# Patient Record
Sex: Female | Born: 1985 | Race: White | Hispanic: No | Marital: Married | Smoking: Never smoker
Health system: Southern US, Community
[De-identification: ages and names within clinical notes are randomized; demographics above are authoritative.]

## PROBLEM LIST (undated history)

## (undated) DIAGNOSIS — N912 Amenorrhea, unspecified: Secondary | ICD-10-CM

## (undated) DIAGNOSIS — T1491XA Suicide attempt, initial encounter: Secondary | ICD-10-CM

## (undated) DIAGNOSIS — F319 Bipolar disorder, unspecified: Secondary | ICD-10-CM

## (undated) HISTORY — DX: Bipolar disorder, unspecified: F31.9

## (undated) HISTORY — DX: Amenorrhea, unspecified: N91.2

## (undated) HISTORY — PX: WISDOM TOOTH EXTRACTION: SHX21

## (undated) HISTORY — DX: Suicide attempt, initial encounter: T14.91XA

---

## 2011-09-18 LAB — HM PAP SMEAR: HM Pap smear: NORMAL

## 2012-05-23 ENCOUNTER — Ambulatory Visit (INDEPENDENT_AMBULATORY_CARE_PROVIDER_SITE_OTHER): Payer: PRIVATE HEALTH INSURANCE | Admitting: Internal Medicine

## 2012-05-23 ENCOUNTER — Encounter: Payer: Self-pay | Admitting: Internal Medicine

## 2012-05-23 VITALS — BP 110/80 | HR 67 | Temp 98.4°F | Resp 16 | Wt 98.0 lb

## 2012-05-23 DIAGNOSIS — Z0000 Encounter for general adult medical examination without abnormal findings: Secondary | ICD-10-CM

## 2012-05-23 DIAGNOSIS — Z111 Encounter for screening for respiratory tuberculosis: Secondary | ICD-10-CM | POA: Insufficient documentation

## 2012-05-23 NOTE — Patient Instructions (Signed)
Preventive Care for Adults, Female A healthy lifestyle and preventive care can promote health and wellness. Preventive health guidelines for women include the following key practices.  A routine yearly physical is a good way to check with your caregiver about your health and preventive screening. It is a chance to share any concerns and updates on your health, and to receive a thorough exam.   Visit your dentist for a routine exam and preventive care every 6 months. Brush your teeth twice a day and floss once a day. Good oral hygiene prevents tooth decay and gum disease.   The frequency of eye exams is based on your age, health, family medical history, use of contact lenses, and other factors. Follow your caregiver's recommendations for frequency of eye exams.   Eat a healthy diet. Foods like vegetables, fruits, whole grains, low-fat dairy products, and lean protein foods contain the nutrients you need without too many calories. Decrease your intake of foods high in solid fats, added sugars, and salt. Eat the right amount of calories for you.Get information about a proper diet from your caregiver, if necessary.   Regular physical exercise is one of the most important things you can do for your health. Most adults should get at least 150 minutes of moderate-intensity exercise (any activity that increases your heart rate and causes you to sweat) each week. In addition, most adults need muscle-strengthening exercises on 2 or more days a week.   Maintain a healthy weight. The body mass index (BMI) is a screening tool to identify possible weight problems. It provides an estimate of body fat based on height and weight. Your caregiver can help determine your BMI, and can help you achieve or maintain a healthy weight.For adults 20 years and older:   A BMI below 18.5 is considered underweight.   A BMI of 18.5 to 24.9 is normal.   A BMI of 25 to 29.9 is considered overweight.   A BMI of 30 and above is  considered obese.   Maintain normal blood lipids and cholesterol levels by exercising and minimizing your intake of saturated fat. Eat a balanced diet with plenty of fruit and vegetables. Blood tests for lipids and cholesterol should begin at age 20 and be repeated every 5 years. If your lipid or cholesterol levels are high, you are over 50, or you are at high risk for heart disease, you may need your cholesterol levels checked more frequently.Ongoing high lipid and cholesterol levels should be treated with medicines if diet and exercise are not effective.   If you smoke, find out from your caregiver how to quit. If you do not use tobacco, do not start.   If you are pregnant, do not drink alcohol. If you are breastfeeding, be very cautious about drinking alcohol. If you are not pregnant and choose to drink alcohol, do not exceed 1 drink per day. One drink is considered to be 12 ounces (355 mL) of beer, 5 ounces (148 mL) of wine, or 1.5 ounces (44 mL) of liquor.   Avoid use of street drugs. Do not share needles with anyone. Ask for help if you need support or instructions about stopping the use of drugs.   High blood pressure causes heart disease and increases the risk of stroke. Your blood pressure should be checked at least every 1 to 2 years. Ongoing high blood pressure should be treated with medicines if weight loss and exercise are not effective.   If you are 55 to 27   years old, ask your caregiver if you should take aspirin to prevent strokes.   Diabetes screening involves taking a blood sample to check your fasting blood sugar level. This should be done once every 3 years, after age 45, if you are within normal weight and without risk factors for diabetes. Testing should be considered at a younger age or be carried out more frequently if you are overweight and have at least 1 risk factor for diabetes.   Breast cancer screening is essential preventive care for women. You should practice "breast  self-awareness." This means understanding the normal appearance and feel of your breasts and may include breast self-examination. Any changes detected, no matter how small, should be reported to a caregiver. Women in their 20s and 30s should have a clinical breast exam (CBE) by a caregiver as part of a regular health exam every 1 to 3 years. After age 40, women should have a CBE every year. Starting at age 40, women should consider having a mammography (breast X-ray test) every year. Women who have a family history of breast cancer should talk to their caregiver about genetic screening. Women at a high risk of breast cancer should talk to their caregivers about having magnetic resonance imaging (MRI) and a mammography every year.   The Pap test is a screening test for cervical cancer. A Pap test can show cell changes on the cervix that might become cervical cancer if left untreated. A Pap test is a procedure in which cells are obtained and examined from the lower end of the uterus (cervix).   Women should have a Pap test starting at age 21.   Between ages 21 and 29, Pap tests should be repeated every 2 years.   Beginning at age 30, you should have a Pap test every 3 years as long as the past 3 Pap tests have been normal.   Some women have medical problems that increase the chance of getting cervical cancer. Talk to your caregiver about these problems. It is especially important to talk to your caregiver if a new problem develops soon after your last Pap test. In these cases, your caregiver may recommend more frequent screening and Pap tests.   The above recommendations are the same for women who have or have not gotten the vaccine for human papillomavirus (HPV).   If you had a hysterectomy for a problem that was not cancer or a condition that could lead to cancer, then you no longer need Pap tests. Even if you no longer need a Pap test, a regular exam is a good idea to make sure no other problems are  starting.   If you are between ages 65 and 70, and you have had normal Pap tests going back 10 years, you no longer need Pap tests. Even if you no longer need a Pap test, a regular exam is a good idea to make sure no other problems are starting.   If you have had past treatment for cervical cancer or a condition that could lead to cancer, you need Pap tests and screening for cancer for at least 20 years after your treatment.   If Pap tests have been discontinued, risk factors (such as a new sexual partner) need to be reassessed to determine if screening should be resumed.   The HPV test is an additional test that may be used for cervical cancer screening. The HPV test looks for the virus that can cause the cell changes on the cervix.   The cells collected during the Pap test can be tested for HPV. The HPV test could be used to screen women aged 30 years and older, and should be used in women of any age who have unclear Pap test results. After the age of 30, women should have HPV testing at the same frequency as a Pap test.   Colorectal cancer can be detected and often prevented. Most routine colorectal cancer screening begins at the age of 50 and continues through age 75. However, your caregiver may recommend screening at an earlier age if you have risk factors for colon cancer. On a yearly basis, your caregiver may provide home test kits to check for hidden blood in the stool. Use of a small camera at the end of a tube, to directly examine the colon (sigmoidoscopy or colonoscopy), can detect the earliest forms of colorectal cancer. Talk to your caregiver about this at age 50, when routine screening begins. Direct examination of the colon should be repeated every 5 to 10 years through age 75, unless early forms of pre-cancerous polyps or small growths are found.   Hepatitis C blood testing is recommended for all people born from 1945 through 1965 and any individual with known risks for hepatitis C.    Practice safe sex. Use condoms and avoid high-risk sexual practices to reduce the spread of sexually transmitted infections (STIs). STIs include gonorrhea, chlamydia, syphilis, trichomonas, herpes, HPV, and human immunodeficiency virus (HIV). Herpes, HIV, and HPV are viral illnesses that have no cure. They can result in disability, cancer, and death. Sexually active women aged 25 and younger should be checked for chlamydia. Older women with new or multiple partners should also be tested for chlamydia. Testing for other STIs is recommended if you are sexually active and at increased risk.   Osteoporosis is a disease in which the bones lose minerals and strength with aging. This can result in serious bone fractures. The risk of osteoporosis can be identified using a bone density scan. Women ages 65 and over and women at risk for fractures or osteoporosis should discuss screening with their caregivers. Ask your caregiver whether you should take a calcium supplement or vitamin D to reduce the rate of osteoporosis.   Menopause can be associated with physical symptoms and risks. Hormone replacement therapy is available to decrease symptoms and risks. You should talk to your caregiver about whether hormone replacement therapy is right for you.   Use sunscreen with sun protection factor (SPF) of 30 or more. Apply sunscreen liberally and repeatedly throughout the day. You should seek shade when your shadow is shorter than you. Protect yourself by wearing long sleeves, pants, a wide-brimmed hat, and sunglasses year round, whenever you are outdoors.   Once a month, do a whole body skin exam, using a mirror to look at the skin on your back. Notify your caregiver of new moles, moles that have irregular borders, moles that are larger than a pencil eraser, or moles that have changed in shape or color.   Stay current with required immunizations.   Influenza. You need a dose every fall (or winter). The composition of  the flu vaccine changes each year, so being vaccinated once is not enough.   Pneumococcal polysaccharide. You need 1 to 2 doses if you smoke cigarettes or if you have certain chronic medical conditions. You need 1 dose at age 65 (or older) if you have never been vaccinated.   Tetanus, diphtheria, pertussis (Tdap, Td). Get 1 dose of   Tdap vaccine if you are younger than age 65, are over 65 and have contact with an infant, are a healthcare worker, are pregnant, or simply want to be protected from whooping cough. After that, you need a Td booster dose every 10 years. Consult your caregiver if you have not had at least 3 tetanus and diphtheria-containing shots sometime in your life or have a deep or dirty wound.   HPV. You need this vaccine if you are a woman age 26 or younger. The vaccine is given in 3 doses over 6 months.   Measles, mumps, rubella (MMR). You need at least 1 dose of MMR if you were born in 1957 or later. You may also need a second dose.   Meningococcal. If you are age 19 to 21 and a first-year college student living in a residence hall, or have one of several medical conditions, you need to get vaccinated against meningococcal disease. You may also need additional booster doses.   Zoster (shingles). If you are age 60 or older, you should get this vaccine.   Varicella (chickenpox). If you have never had chickenpox or you were vaccinated but received only 1 dose, talk to your caregiver to find out if you need this vaccine.   Hepatitis A. You need this vaccine if you have a specific risk factor for hepatitis A virus infection or you simply wish to be protected from this disease. The vaccine is usually given as 2 doses, 6 to 18 months apart.   Hepatitis B. You need this vaccine if you have a specific risk factor for hepatitis B virus infection or you simply wish to be protected from this disease. The vaccine is given in 3 doses, usually over 6 months.  Preventive Services /  Frequency Ages 19 to 39  Blood pressure check.** / Every 1 to 2 years.   Lipid and cholesterol check.** / Every 5 years beginning at age 20.   Clinical breast exam.** / Every 3 years for women in their 20s and 30s.   Pap test.** / Every 2 years from ages 21 through 29. Every 3 years starting at age 30 through age 65 or 70 with a history of 3 consecutive normal Pap tests.   HPV screening.** / Every 3 years from ages 30 through ages 65 to 70 with a history of 3 consecutive normal Pap tests.   Hepatitis C blood test.** / For any individual with known risks for hepatitis C.   Skin self-exam. / Monthly.   Influenza immunization.** / Every year.   Pneumococcal polysaccharide immunization.** / 1 to 2 doses if you smoke cigarettes or if you have certain chronic medical conditions.   Tetanus, diphtheria, pertussis (Tdap, Td) immunization. / A one-time dose of Tdap vaccine. After that, you need a Td booster dose every 10 years.   HPV immunization. / 3 doses over 6 months, if you are 26 and younger.   Measles, mumps, rubella (MMR) immunization. / You need at least 1 dose of MMR if you were born in 1957 or later. You may also need a second dose.   Meningococcal immunization. / 1 dose if you are age 19 to 21 and a first-year college student living in a residence hall, or have one of several medical conditions, you need to get vaccinated against meningococcal disease. You may also need additional booster doses.   Varicella immunization.** / Consult your caregiver.   Hepatitis A immunization.** / Consult your caregiver. 2 doses, 6 to 18 months   apart.   Hepatitis B immunization.** / Consult your caregiver. 3 doses usually over 6 months.  Ages 40 to 64  Blood pressure check.** / Every 1 to 2 years.   Lipid and cholesterol check.** / Every 5 years beginning at age 20.   Clinical breast exam.** / Every year after age 40.   Mammogram.** / Every year beginning at age 40 and continuing for as  long as you are in good health. Consult with your caregiver.   Pap test.** / Every 3 years starting at age 30 through age 65 or 70 with a history of 3 consecutive normal Pap tests.   HPV screening.** / Every 3 years from ages 30 through ages 65 to 70 with a history of 3 consecutive normal Pap tests.   Fecal occult blood test (FOBT) of stool. / Every year beginning at age 50 and continuing until age 75. You may not need to do this test if you get a colonoscopy every 10 years.   Flexible sigmoidoscopy or colonoscopy.** / Every 5 years for a flexible sigmoidoscopy or every 10 years for a colonoscopy beginning at age 50 and continuing until age 75.   Hepatitis C blood test.** / For all people born from 1945 through 1965 and any individual with known risks for hepatitis C.   Skin self-exam. / Monthly.   Influenza immunization.** / Every year.   Pneumococcal polysaccharide immunization.** / 1 to 2 doses if you smoke cigarettes or if you have certain chronic medical conditions.   Tetanus, diphtheria, pertussis (Tdap, Td) immunization.** / A one-time dose of Tdap vaccine. After that, you need a Td booster dose every 10 years.   Measles, mumps, rubella (MMR) immunization. / You need at least 1 dose of MMR if you were born in 1957 or later. You may also need a second dose.   Varicella immunization.** / Consult your caregiver.   Meningococcal immunization.** / Consult your caregiver.   Hepatitis A immunization.** / Consult your caregiver. 2 doses, 6 to 18 months apart.   Hepatitis B immunization.** / Consult your caregiver. 3 doses, usually over 6 months.  Ages 65 and over  Blood pressure check.** / Every 1 to 2 years.   Lipid and cholesterol check.** / Every 5 years beginning at age 20.   Clinical breast exam.** / Every year after age 40.   Mammogram.** / Every year beginning at age 40 and continuing for as long as you are in good health. Consult with your caregiver.   Pap test.** /  Every 3 years starting at age 30 through age 65 or 70 with a 3 consecutive normal Pap tests. Testing can be stopped between 65 and 70 with 3 consecutive normal Pap tests and no abnormal Pap or HPV tests in the past 10 years.   HPV screening.** / Every 3 years from ages 30 through ages 65 or 70 with a history of 3 consecutive normal Pap tests. Testing can be stopped between 65 and 70 with 3 consecutive normal Pap tests and no abnormal Pap or HPV tests in the past 10 years.   Fecal occult blood test (FOBT) of stool. / Every year beginning at age 50 and continuing until age 75. You may not need to do this test if you get a colonoscopy every 10 years.   Flexible sigmoidoscopy or colonoscopy.** / Every 5 years for a flexible sigmoidoscopy or every 10 years for a colonoscopy beginning at age 50 and continuing until age 75.   Hepatitis   C blood test.** / For all people born from 1945 through 1965 and any individual with known risks for hepatitis C.   Osteoporosis screening.** / A one-time screening for women ages 65 and over and women at risk for fractures or osteoporosis.   Skin self-exam. / Monthly.   Influenza immunization.** / Every year.   Pneumococcal polysaccharide immunization.** / 1 dose at age 65 (or older) if you have never been vaccinated.   Tetanus, diphtheria, pertussis (Tdap, Td) immunization. / A one-time dose of Tdap vaccine if you are over 65 and have contact with an infant, are a healthcare worker, or simply want to be protected from whooping cough. After that, you need a Td booster dose every 10 years.   Varicella immunization.** / Consult your caregiver.   Meningococcal immunization.** / Consult your caregiver.   Hepatitis A immunization.** / Consult your caregiver. 2 doses, 6 to 18 months apart.   Hepatitis B immunization.** / Check with your caregiver. 3 doses, usually over 6 months.  ** Family history and personal history of risk and conditions may change your caregiver's  recommendations. Document Released: 12/18/2001 Document Revised: 10/11/2011 Document Reviewed: 03/19/2011 [employer] Patient Information 2012 [employer]. 

## 2012-05-25 ENCOUNTER — Encounter: Payer: Self-pay | Admitting: Internal Medicine

## 2012-05-25 NOTE — Assessment & Plan Note (Signed)
Exam done TB placed, she will RTC in 2-3 days for a reading, she was given pt ed material

## 2012-05-25 NOTE — Progress Notes (Signed)
  Subjective:    Patient ID: Casey Glenn, female    DOB: 08/09/1986, 26 y.o.   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  Review of Systems  Constitutional: Negative.   HENT: Negative.   Eyes: Negative.   Respiratory: Negative.   Cardiovascular: Negative.   Gastrointestinal: Negative.   Genitourinary: Negative.   Musculoskeletal: Negative.   Skin: Negative.   Neurological: Negative.   Hematological: Negative.   Psychiatric/Behavioral: Negative.        Objective:   Physical Exam  Vitals reviewed. Constitutional: She is oriented to person, place, and time. She appears well-developed and well-nourished. No distress.  HENT:  Head: Normocephalic and atraumatic.  Mouth/Throat: Oropharynx is clear and moist. No oropharyngeal exudate.  Eyes: Conjunctivae are normal. Right eye exhibits no discharge. Left eye exhibits no discharge. No scleral icterus.  Neck: Normal range of motion. Neck supple. No JVD present. No tracheal deviation present. No thyromegaly present.  Cardiovascular: Normal rate, regular rhythm, normal heart sounds and intact distal pulses.  Exam reveals no gallop and no friction rub.   No murmur heard. Pulmonary/Chest: Effort normal and breath sounds normal. No stridor. No respiratory distress. She has no wheezes. She has no rales. She exhibits no tenderness.  Abdominal: Soft. Bowel sounds are normal. She exhibits no distension and no mass. There is no tenderness. There is no rebound and no guarding.  Musculoskeletal: Normal range of motion. She exhibits no edema and no tenderness.  Lymphadenopathy:    She has no cervical adenopathy.  Neurological: She is oriented to person, place, and time.  Skin: Skin is warm and dry. No rash noted. She is not diaphoretic. No erythema. No pallor.  Psychiatric: She has a normal mood and affect. Her behavior is normal. Judgment and thought  content normal.          Assessment & Plan:

## 2012-11-28 ENCOUNTER — Encounter: Payer: Self-pay | Admitting: Internal Medicine

## 2012-11-28 ENCOUNTER — Other Ambulatory Visit (INDEPENDENT_AMBULATORY_CARE_PROVIDER_SITE_OTHER): Payer: BC Managed Care – PPO

## 2012-11-28 ENCOUNTER — Ambulatory Visit (INDEPENDENT_AMBULATORY_CARE_PROVIDER_SITE_OTHER): Payer: BC Managed Care – PPO | Admitting: Internal Medicine

## 2012-11-28 VITALS — BP 102/58 | HR 47 | Temp 97.8°F | Resp 10 | Ht 62.0 in | Wt 95.0 lb

## 2012-11-28 DIAGNOSIS — F329 Major depressive disorder, single episode, unspecified: Secondary | ICD-10-CM

## 2012-11-28 DIAGNOSIS — F32A Depression, unspecified: Secondary | ICD-10-CM

## 2012-11-28 DIAGNOSIS — N912 Amenorrhea, unspecified: Secondary | ICD-10-CM

## 2012-11-28 DIAGNOSIS — F339 Major depressive disorder, recurrent, unspecified: Secondary | ICD-10-CM | POA: Insufficient documentation

## 2012-11-28 LAB — COMPREHENSIVE METABOLIC PANEL
AST: 27 U/L (ref 0–37)
Alkaline Phosphatase: 55 U/L (ref 39–117)
BUN: 8 mg/dL (ref 6–23)
Creatinine, Ser: 0.7 mg/dL (ref 0.4–1.2)
Glucose, Bld: 88 mg/dL (ref 70–99)
Total Bilirubin: 0.7 mg/dL (ref 0.3–1.2)

## 2012-11-28 LAB — CBC WITH DIFFERENTIAL/PLATELET
Basophils Relative: 1.1 % (ref 0.0–3.0)
Eosinophils Absolute: 0 10*3/uL (ref 0.0–0.7)
Eosinophils Relative: 0.6 % (ref 0.0–5.0)
HCT: 45.2 % (ref 36.0–46.0)
Lymphs Abs: 1.7 10*3/uL (ref 0.7–4.0)
MCHC: 33.7 g/dL (ref 30.0–36.0)
MCV: 92.7 fl (ref 78.0–100.0)
Monocytes Absolute: 0.3 10*3/uL (ref 0.1–1.0)
Neutrophils Relative %: 61.2 % (ref 43.0–77.0)
RBC: 4.87 Mil/uL (ref 3.87–5.11)
WBC: 5.4 10*3/uL (ref 4.5–10.5)

## 2012-11-28 LAB — HCG, QUANTITATIVE, PREGNANCY: hCG, Beta Chain, Quant, S: 0.37 m[IU]/mL

## 2012-11-28 LAB — LUTEINIZING HORMONE: LH: 1.69 m[IU]/mL

## 2012-11-28 LAB — FOLLICLE STIMULATING HORMONE: FSH: 5.1 m[IU]/mL

## 2012-11-28 NOTE — Assessment & Plan Note (Signed)
No changes today

## 2012-11-28 NOTE — Assessment & Plan Note (Signed)
I think her lack of menses is due to low body weight/low body fat - I am concerned that she has an exercise addiction and/or anorexia, she was asked to discuss with Dr. Dub Mikes. I will check her labs today to look for other causes of amenorrhea (thyroid disease, premature ovarian failure, anemia, pregnancy, abnormal lytes.) She was referred to GYN for further evaluation.

## 2012-11-28 NOTE — Patient Instructions (Signed)
Primary Amenorrhea  Primary amenorrhea is the absence of any menstrual flow in a woman by the age of sixteen. An average age for the start of menstruation is age twelve. Primary amenorrhea is not considered to have occurred until a girl is over age 28 and has never menstruated. This may occur with or without other signs of puberty. CAUSES  Some common causes of not menstruating include:  Malnutrition.  Low blood sugar (hypoglycemia).  Polycystic ovarian disease (cysts in the ovaries, not ovulating).  Absence of the vagina, uterus, or ovaries since birth (congenital).  Extreme obesity.  Cystic fibrosis.  Drastic weight loss from any cause.  Over-exercising (running, biking) causing loss of body fat.  Pituitary gland tumor in the brain.  Long-term (chronic) illnesses.  Cushing's disease.  Thyroid disease (hypothyroidism, hyperthyroidism).  Part of the brain (hypothalamus) not functioning.  Premature ovarian failure. DIAGNOSIS  This diagnosis is made by doing a medical history and physical exam. Often, numerous blood tests of different hormones in the body may be done, along with some urine testing. Specialized x-rays may need to be done, as well as measuring the Body Mass Index (BMI). If your BMI is less than 20, the hypothalamus may be the problem. If it is greater than 30, the cause may be diabetes mellitus. Pregnancy must be ruled out. TREATMENT  Treatment depends on the cause of the amenorrhea. If an eating disorder is present, this can be treated with an adequate diet and therapy. Chronic illnesses may improve with treatment of the illness. Overall, the outlook is good, and amenorrhea may be corrected with medicines, lifestyle changes, or surgery. If the amenorrhea can not be corrected, it is sometimes possible to create a false menstruation with medicines, to help young women feel more normal. Should emotional problems occur, your caregiver will be able to help you.  Document  Released: 10/22/2005 Document Revised: 01/14/2012 Document Reviewed: 06/09/2008 [employer] Patient Information 2013 [employer].  

## 2012-11-28 NOTE — Progress Notes (Signed)
  Subjective:    Patient ID: Casey Glenn, female    DOB: [birth year], 27 y.o.   MRN: [id number]  HPI  She returns and complains that she has not had a cycle in almost one year. She exercises 2 times per day (elliptical and yoga) 5-6 times per week. She has lost 15# over the last year. She has felt stressed due to a separation from her husband and moving two times. She has been seen by a PA with Dr. Dub Mikes and her meds have been adjusted.   Review of Systems  Constitutional: Positive for unexpected weight change. Negative for fever, chills, diaphoresis, activity change, appetite change and fatigue.  HENT: Negative.   Eyes: Negative.   Respiratory: Negative.   Cardiovascular: Negative.   Gastrointestinal: Negative for nausea, abdominal pain, diarrhea, constipation, blood in stool, abdominal distention, anal bleeding and rectal pain.  Genitourinary: Positive for menstrual problem. Negative for vaginal bleeding, vaginal discharge, difficulty urinating and vaginal pain.  Musculoskeletal: Negative.   Skin: Negative.   Neurological: Negative.   Hematological: Negative for adenopathy. Does not bruise/bleed easily.  Psychiatric/Behavioral: Positive for dysphoric mood. Negative for suicidal ideas, hallucinations, behavioral problems, confusion, sleep disturbance, self-injury, decreased concentration and agitation. The patient is not nervous/anxious and is not hyperactive.        Objective:   Physical Exam  Vitals reviewed. Constitutional: She is oriented to person, place, and time. She appears well-developed and well-nourished. No distress.  HENT:  Head: Normocephalic and atraumatic.  Mouth/Throat: Oropharynx is clear and moist. No oropharyngeal exudate.  Eyes: Conjunctivae normal are normal. Right eye exhibits no discharge. Left eye exhibits no discharge. No scleral icterus.  Neck: Normal range of motion. Neck supple. No JVD present. No tracheal deviation present. No thyromegaly present.    Cardiovascular: Normal rate, regular rhythm, normal heart sounds and intact distal pulses.  Exam reveals no gallop and no friction rub.   No murmur heard. Pulmonary/Chest: Effort normal and breath sounds normal. No stridor. No respiratory distress. She has no wheezes. She has no rales. She exhibits no tenderness.  Abdominal: Soft. Bowel sounds are normal. She exhibits no distension. There is no tenderness. There is no rebound and no guarding.  Musculoskeletal: Normal range of motion. She exhibits no edema and no tenderness.  Lymphadenopathy:    She has no cervical adenopathy.  Neurological: She is oriented to person, place, and time.  Skin: Skin is warm and dry. No rash noted. She is not diaphoretic. No erythema. No pallor.  Psychiatric: She has a normal mood and affect. Her speech is normal and behavior is normal. Judgment and thought content normal. Her mood appears not anxious. Her affect is not angry, not blunt, not labile and not inappropriate. Cognition and memory are normal. She does not exhibit a depressed mood.      No results found for this basename: WBC, HGB, HCT, PLT, GLUCOSE, CHOL, TRIG, HDL, LDLDIRECT, LDLCALC, ALT, AST, NA, K, CL, CREATININE, BUN, CO2, TSH, PSA, INR, GLUF, HGBA1C, MICROALBUR      Assessment & Plan:

## 2012-12-04 ENCOUNTER — Encounter: Payer: Self-pay | Admitting: Obstetrics and Gynecology

## 2012-12-24 ENCOUNTER — Encounter: Payer: BC Managed Care – PPO | Admitting: Obstetrics and Gynecology

## 2012-12-31 ENCOUNTER — Encounter: Payer: BC Managed Care – PPO | Admitting: Obstetrics and Gynecology

## 2013-01-14 ENCOUNTER — Encounter: Payer: Self-pay | Admitting: Obstetrics & Gynecology

## 2013-01-14 ENCOUNTER — Ambulatory Visit (INDEPENDENT_AMBULATORY_CARE_PROVIDER_SITE_OTHER): Payer: BC Managed Care – PPO | Admitting: Obstetrics & Gynecology

## 2013-01-14 VITALS — BP 134/87 | HR 51 | Temp 96.7°F | Resp 20 | Ht 62.0 in | Wt 94.7 lb

## 2013-01-14 DIAGNOSIS — N912 Amenorrhea, unspecified: Secondary | ICD-10-CM

## 2013-01-14 NOTE — Progress Notes (Signed)
Subjective:    Casey Glenn is a 27 y.o. female who presents for an annual exam.26 yo separated W G0 who is referred here for amenorrhea, possibly exercise- induced amenorrhea. Her labs to date are normal. She tells me that she only started her period at age 28 when she was started on OCPs. She does describe a progesterone challenge as a teenager. She has always been thin with a BMI of 17 today. She does report a recent 10 pound weight loss during the last few months while adjusting/changing her psychiatric meds and also during the stress of separation. She does hot yoga several times per week. The patient is sexually active. GYN screening history: last pap: was normal. The patient wears seatbelts: yes. The patient participates in regular exercise: yes. Has the patient ever been transfused or tattooed?: no. The patient reports that there is not domestic violence in her life.   Menstrual History: OB History   Grav Para Term Preterm Abortions TAB SAB Ect Mult Living   0               Menarche age: 35  Patient's last menstrual period was 01/04/2012.    The following portions of the patient's history were reviewed and updated as appropriate: allergies, current medications, past family history, past medical history, past social history, past surgical history and problem list.  Review of Systems A comprehensive review of systems was negative.    Objective:    BP 134/87  Pulse 51  Temp(Src) 96.7 F (35.9 C) (Oral)  Resp 20  Ht 5\' 2"  (1.575 m)  Wt 94 lb 11.2 oz (42.956 kg)  BMI 17.32 kg/m2  LMP 01/04/2012  General Appearance:    Alert, cooperative, no distress, appears stated age  Head:    Normocephalic, without obvious abnormality, atraumatic  Eyes:    PERRL, conjunctiva/corneas clear, EOM's intact, fundi    benign, both eyes  Ears:    Normal TM's and external ear canals, both ears  Nose:   Nares normal, septum midline, mucosa normal, no drainage    or sinus tenderness  Throat:   Lips,  mucosa, and tongue normal; teeth and gums normal  Neck:   Supple, symmetrical, trachea midline, no adenopathy;    thyroid:  no enlargement/tenderness/nodules; no carotid   bruit or JVD  Back:     Symmetric, no curvature, ROM normal, no CVA tenderness  Lungs:     Clear to auscultation bilaterally, respirations unlabored  Chest Wall:    No tenderness or deformity   Heart:    Regular rate and rhythm, S1 and S2 normal, no murmur, rub   or gallop  Breast Exam:    No tenderness, masses, or nipple abnormality  Abdomen:     Soft, non-tender, bowel sounds active all four quadrants,    no masses, no organomegaly  Genitalia:    Normal female without lesion, discharge or tenderness, very small retroverted uterus with non-palpable adnexa     Extremities:   Extremities normal, atraumatic, no cyanosis or edema  Pulses:   2+ and symmetric all extremities  Skin:   Skin color, texture, turgor normal, no rashes or lesions  Lymph nodes:   Cervical, supraclavicular, and axillary nodes normal  Neurologic:   CNII-XII intact, normal strength, sensation and reflexes    throughout  .    Assessment:    Healthy female exam.  Amenorrhea   Plan:     Pelvic ultrasound. Thin prep Pap smear.  (to evaluate her uterine lining)

## 2013-01-15 LAB — PROLACTIN: Prolactin: 10.5 ng/mL

## 2013-01-16 ENCOUNTER — Telehealth: Payer: Self-pay | Admitting: *Deleted

## 2013-01-16 ENCOUNTER — Ambulatory Visit (HOSPITAL_COMMUNITY): Payer: BC Managed Care – PPO

## 2013-01-16 NOTE — Telephone Encounter (Signed)
Pt left message stating that she is not able to afford the co-pay for her [place] right now. She would like Dr. Marice Potter to call her

## 2013-01-19 ENCOUNTER — Telehealth: Payer: Self-pay

## 2013-01-19 NOTE — Telephone Encounter (Addendum)
Patient called to see if her referral appointment to [facility] ([address]) has been scheduled.  Message had been left for patient voicemail by [place] Muta Day, RN earlier today with appointment details.

## 2013-01-19 NOTE — Telephone Encounter (Signed)
Spoke w/pt and she stated that the US exam will be too expensive if she has it done @ [facility]. If she has it at a free-standing radiology/imaging facility, her insurance will pay 100%.  I re-scheduled pt [place] appt @ [facility] [address]  [phone].  The appt is 01/21/13 @ 1515.  Pt was notified of appt details.

## 2013-01-20 NOTE — Telephone Encounter (Signed)
Duplicate encounter

## 2013-01-21 ENCOUNTER — Other Ambulatory Visit: Payer: BC Managed Care – PPO

## 2013-01-23 ENCOUNTER — Ambulatory Visit
Admission: RE | Admit: 2013-01-23 | Discharge: 2013-01-23 | Disposition: A | Discharge status: Home / Self Care | Payer: BC Managed Care – PPO | Source: Ambulatory Visit | Attending: Obstetrics & Gynecology | Admitting: Obstetrics & Gynecology

## 2013-01-23 DIAGNOSIS — N912 Amenorrhea, unspecified: Secondary | ICD-10-CM

## 2013-01-28 ENCOUNTER — Telehealth: Payer: Self-pay | Admitting: *Deleted

## 2013-01-28 NOTE — Telephone Encounter (Signed)
Called Casey Glenn and left a message that we got her message and we wanted to call her back and let her know she has an appointment scheduled to go over all results on 02/11/13-please call if you still have questions.  ( needs to discuss with Dr. Marice Potter she has PCOS and treatment plans. )

## 2013-01-28 NOTE — Telephone Encounter (Signed)
Casey Glenn called and left a message requesting results of her ultrasound done last week.

## 2013-02-11 ENCOUNTER — Ambulatory Visit: Payer: BC Managed Care – PPO | Admitting: Obstetrics & Gynecology

## 2013-03-06 ENCOUNTER — Telehealth: Payer: Self-pay | Admitting: Internal Medicine

## 2013-03-06 DIAGNOSIS — F329 Major depressive disorder, single episode, unspecified: Secondary | ICD-10-CM

## 2013-03-06 DIAGNOSIS — F32A Depression, unspecified: Secondary | ICD-10-CM

## 2013-03-06 NOTE — Telephone Encounter (Signed)
done

## 2013-03-06 NOTE — Telephone Encounter (Signed)
The pt is in need of a referral to a physiatrist - Phillip Heal (fax number [phone], phone - [phone])   Pt's callback is - [phone]

## 2014-02-23 ENCOUNTER — Encounter: Payer: Self-pay | Admitting: Obstetrics & Gynecology

## 2014-02-23 ENCOUNTER — Ambulatory Visit (INDEPENDENT_AMBULATORY_CARE_PROVIDER_SITE_OTHER): Payer: BC Managed Care – PPO | Admitting: Obstetrics & Gynecology

## 2014-02-23 VITALS — BP 114/76 | HR 76 | Resp 16 | Ht 62.0 in | Wt 97.0 lb

## 2014-02-23 DIAGNOSIS — E282 Polycystic ovarian syndrome: Secondary | ICD-10-CM

## 2014-02-23 DIAGNOSIS — Z01419 Encounter for gynecological examination (general) (routine) without abnormal findings: Secondary | ICD-10-CM

## 2014-02-23 DIAGNOSIS — Z124 Encounter for screening for malignant neoplasm of cervix: Secondary | ICD-10-CM

## 2014-02-23 DIAGNOSIS — Z0000 Encounter for general adult medical examination without abnormal findings: Secondary | ICD-10-CM

## 2014-02-23 MED ORDER — MEDROXYPROGESTERONE ACETATE 10 MG PO TABS: 10.0000 mg | ORAL_TABLET | Freq: Every day | ORAL | Status: DC

## 2014-02-23 NOTE — Progress Notes (Signed)
Subjective:    Casey Bailiffmily G Grudzien is a 28 y.o. female who presents for an annual exam. She quit her OCPs about a year ago because it was affecting her mood. She was diagnosed with Bipolar.  The patient is sexually active. GYN screening history: last pap: was normal. The patient wears seatbelts: yes. The patient participates in regular exercise: yes. Has the patient ever been transfused or tattooed?: no. The patient reports that there is not domestic violence in her life.   Menstrual History: OB History   Grav Para Term Preterm Abortions TAB SAB Ect Mult Living   0               Menarche age: [age] with OCPs  No LMP recorded. Patient is not currently having periods (Reason: Other).    The following portions of the patient's history were reviewed and updated as appropriate: allergies, current medications, past family history, past medical history, past social history, past surgical history and problem list.  Review of Systems A comprehensive review of systems was negative. Uses condoms.   Objective:    BP 114/76  Pulse 76  Resp 16  Ht 5\' 2"  (1.575 m)  Wt 97 lb (43.999 kg)  BMI 17.74 kg/m2  General Appearance:    Alert, cooperative, no distress, appears stated age  Head:    Normocephalic, without obvious abnormality, atraumatic  Eyes:    PERRL, conjunctiva/corneas clear, EOM's intact, fundi    benign, both eyes  Ears:    Normal TM's and external ear canals, both ears  Nose:   Nares normal, septum midline, mucosa normal, no drainage    or sinus tenderness  Throat:   Lips, mucosa, and tongue normal; teeth and gums normal  Neck:   Supple, symmetrical, trachea midline, no adenopathy;    thyroid:  no enlargement/tenderness/nodules; no carotid   bruit or JVD  Back:     Symmetric, no curvature, ROM normal, no CVA tenderness  Lungs:     Clear to auscultation bilaterally, respirations unlabored  Chest Wall:    No tenderness or deformity   Heart:    Regular rate and rhythm, S1 and S2 normal, no  murmur, rub   or gallop  Breast Exam:    No tenderness, masses, or nipple abnormality  Abdomen:     Soft, non-tender, bowel sounds active all four quadrants,    no masses, no organomegaly  Genitalia:    Normal female without lesion, discharge or tenderness, NSSR, NT, normal adnexal exam     Extremities:   Extremities normal, atraumatic, no cyanosis or edema  Pulses:   2+ and symmetric all extremities  Skin:   Skin color, texture, turgor normal, no rashes or lesions  Lymph nodes:   Cervical, supraclavicular, and axillary nodes normal  Neurologic:   CNII-XII intact, normal strength, sensation and reflexes    throughout  .    Assessment:    Healthy female exam.  PCOS, amenorrhea   Plan:     Breast self exam technique reviewed and patient encouraged to perform self-exam monthly. Thin prep Pap smear. fasting labs  We had a long talk about keeping her uterine lining thin to prevent increased risk of uterine cancer in the future. I have recommended provera 10 mg cyclicly for at least 4-6 periods per year. If this affects her mood too much, then I will follow her lining with u/s.

## 2014-03-11 ENCOUNTER — Other Ambulatory Visit: Payer: BC Managed Care – PPO

## 2014-03-12 LAB — COMPREHENSIVE METABOLIC PANEL
ALBUMIN: 4.7 g/dL (ref 3.5–5.2)
ALT: 24 U/L (ref 0–35)
AST: 32 U/L (ref 0–37)
Alkaline Phosphatase: 70 U/L (ref 39–117)
BUN: 11 mg/dL (ref 6–23)
CALCIUM: 9.8 mg/dL (ref 8.4–10.5)
CHLORIDE: 98 meq/L (ref 96–112)
CO2: 31 meq/L (ref 19–32)
Creat: 0.84 mg/dL (ref 0.50–1.10)
Glucose, Bld: 81 mg/dL (ref 70–99)
POTASSIUM: 4.1 meq/L (ref 3.5–5.3)
Sodium: 139 mEq/L (ref 135–145)
Total Bilirubin: 1 mg/dL (ref 0.2–1.2)
Total Protein: 6.8 g/dL (ref 6.0–8.3)

## 2014-03-12 LAB — LIPID PANEL
Cholesterol: 132 mg/dL (ref 0–200)
HDL: 64 mg/dL (ref >39)
LDL Cholesterol: 61 mg/dL (ref 0–99)
Total CHOL/HDL Ratio: 2.1 Ratio
Triglycerides: 36 mg/dL (ref <150)
VLDL: 7 mg/dL (ref 0–40)

## 2014-03-12 LAB — CBC
HEMATOCRIT: 44.9 % (ref 36.0–46.0)
Hemoglobin: 15.2 g/dL — ABNORMAL HIGH (ref 12.0–15.0)
MCH: 30.1 pg (ref 26.0–34.0)
MCHC: 33.9 g/dL (ref 30.0–36.0)
MCV: 88.9 fL (ref 78.0–100.0)
Platelets: 211 10*3/uL (ref 150–400)
RBC: 5.05 MIL/uL (ref 3.87–5.11)
RDW: 13.5 % (ref 11.5–15.5)
WBC: 4 10*3/uL (ref 4.0–10.5)

## 2014-03-12 LAB — TESTOSTERONE, FREE, TOTAL, SHBG
Sex Hormone Binding: 95 nmol/L (ref 18–114)
TESTOSTERONE-% FREE: 0.9 % (ref 0.4–2.4)
TESTOSTERONE: 45 ng/dL (ref 10–70)
Testosterone, Free: 3.8 pg/mL (ref 0.6–6.8)

## 2014-03-12 LAB — TSH: TSH: 2.097 u[IU]/mL (ref 0.350–4.500)

## 2014-03-15 LAB — ESTROGENS, TOTAL: Estrogen: 87 pg/mL

## 2014-05-04 ENCOUNTER — Telehealth: Payer: Self-pay | Admitting: *Deleted

## 2014-05-04 NOTE — Telephone Encounter (Signed)
Pt called stating that when she saw Dr Marice Potterove in April that she offered to either take Provera 10x10 or be followed with U/S to check endometrial lining due to PCOS.  Pt will decide and call me back.  i recommended that she at least try it 1 cycle.

## 2014-05-11 ENCOUNTER — Telehealth: Payer: Self-pay | Admitting: *Deleted

## 2014-05-11 NOTE — Telephone Encounter (Signed)
Pt called stating that she thinks she is having adverse reaction to her Provera.  She has been fine up to day 8 of the meds.  She states that she is feeling emotional and very irritated.  She is tearful and doesn't know if she should stop her the Provera.  I offered an appointment with Dr Marice Potterove tomorrow.  She says she might call her psychiatrist to check on her Lamictal since she has the diagnosis of bipolar disease.  She states that she will call back if she wants appt with Dr Marice Potterove.

## 2014-05-21 ENCOUNTER — Telehealth: Payer: Self-pay | Admitting: *Deleted

## 2014-05-21 ENCOUNTER — Other Ambulatory Visit: Payer: Self-pay | Admitting: Obstetrics & Gynecology

## 2014-05-21 DIAGNOSIS — N912 Amenorrhea, unspecified: Secondary | ICD-10-CM

## 2014-05-21 NOTE — Telephone Encounter (Signed)
Appt made with [facility]  For 05/24/14 @ 1:00  Full bladder.

## 2014-05-21 NOTE — Telephone Encounter (Signed)
Pt cannot take progesterone agents due to her bipolar disease.  She has stopped her Provera and per Dr Marice Potterove will follow with U/S 1-2 times a year to check for endometrial thickness.

## 2014-05-24 ENCOUNTER — Ambulatory Visit
Admission: RE | Admit: 2014-05-24 | Discharge: 2014-05-24 | Disposition: A | Discharge status: Home / Self Care | Payer: BC Managed Care – PPO | Source: Ambulatory Visit | Attending: Obstetrics & Gynecology | Admitting: Obstetrics & Gynecology

## 2014-05-24 DIAGNOSIS — N912 Amenorrhea, unspecified: Secondary | ICD-10-CM

## 2014-07-28 ENCOUNTER — Telehealth: Payer: Self-pay

## 2014-07-28 NOTE — Telephone Encounter (Signed)
Flu vaccine documentation 

## 2014-11-09 ENCOUNTER — Ambulatory Visit (HOSPITAL_COMMUNITY): Payer: BC Managed Care – PPO | Admitting: Psychiatry

## 2014-11-11 ENCOUNTER — Ambulatory Visit (HOSPITAL_COMMUNITY): Payer: BC Managed Care – PPO | Admitting: Psychiatry

## 2015-04-19 ENCOUNTER — Ambulatory Visit (INDEPENDENT_AMBULATORY_CARE_PROVIDER_SITE_OTHER): Payer: BC Managed Care – PPO | Admitting: Obstetrics & Gynecology

## 2015-04-19 ENCOUNTER — Encounter: Payer: Self-pay | Admitting: Obstetrics & Gynecology

## 2015-04-19 VITALS — Resp 16 | Ht 62.0 in | Wt 97.0 lb

## 2015-04-19 DIAGNOSIS — Z124 Encounter for screening for malignant neoplasm of cervix: Secondary | ICD-10-CM | POA: Diagnosis not present

## 2015-04-19 DIAGNOSIS — Z0000 Encounter for general adult medical examination without abnormal findings: Secondary | ICD-10-CM

## 2015-04-19 DIAGNOSIS — Z01419 Encounter for gynecological examination (general) (routine) without abnormal findings: Secondary | ICD-10-CM | POA: Diagnosis not present

## 2015-04-19 NOTE — Progress Notes (Signed)
Subjective:    Casey Glenn is a 29 y.o. MW G0 female who presents for an annual exam. The patient has no complaints today. The patient is sexually active. GYN screening history: last pap: was normal. The patient wears seatbelts: yes. The patient participates in regular exercise: yes. Has the patient ever been transfused or tattooed?: no. The patient reports that there is not domestic violence in her life.   Menstrual History: OB History    Gravida Para Term Preterm AB TAB SAB Ectopic Multiple Living   0               Menarche age: n/a  No LMP recorded. Patient is not currently having periods (Reason: Other).    The following portions of the patient's history were reviewed and updated as appropriate: allergies, current medications, past family history, past medical history, past social history, past surgical history and problem list.  Review of Systems A comprehensive review of systems was negative.    Objective:    Resp 16  Ht 5\' 2"  (1.575 m)  Wt 97 lb (43.999 kg)  BMI 17.74 kg/m2  General Appearance:    Alert, cooperative, no distress, appears stated age  Head:    Normocephalic, without obvious abnormality, atraumatic  Eyes:    PERRL, conjunctiva/corneas clear, EOM's intact, fundi    benign, both eyes  Ears:    Normal TM's and external ear canals, both ears  Nose:   Nares normal, septum midline, mucosa normal, no drainage    or sinus tenderness  Throat:   Lips, mucosa, and tongue normal; teeth and gums normal  Neck:   Supple, symmetrical, trachea midline, no adenopathy;    thyroid:  no enlargement/tenderness/nodules; no carotid   bruit or JVD  Back:     Symmetric, no curvature, ROM normal, no CVA tenderness  Lungs:     Clear to auscultation bilaterally, respirations unlabored  Chest Wall:    No tenderness or deformity   Heart:    Regular rate and rhythm, S1 and S2 normal, no murmur, rub   or gallop  Breast Exam:    No tenderness, masses, or nipple abnormality  Abdomen:      Soft, non-tender, bowel sounds active all four quadrants,    no masses, no organomegaly  Genitalia:    Normal female without lesion, discharge or tenderness  Rectal:    Normal tone, normal prostate, no masses or tenderness;   guaiac negative stool  Extremities:   Extremities normal, atraumatic, no cyanosis or edema  Pulses:   2+ and symmetric all extremities  Skin:   Skin color, texture, turgor normal, no rashes or lesions  Lymph nodes:   Cervical, supraclavicular, and axillary nodes normal  Neurologic:   CNII-XII intact, normal strength, sensation and reflexes    throughout  .    Assessment:    Healthy female exam.    Plan:     Breast self exam technique reviewed and patient encouraged to perform self-exam monthly. Thin prep Pap smear.   We discussed the possibility of her conceiving. She is not sure exactly at this moment if she wants kids but she is aware of the risks of AMA and her possible need for ovulation induction agents.

## 2015-04-21 LAB — CYTOLOGY - PAP

## 2016-04-19 ENCOUNTER — Ambulatory Visit (INDEPENDENT_AMBULATORY_CARE_PROVIDER_SITE_OTHER): Payer: BC Managed Care – PPO | Admitting: Obstetrics & Gynecology

## 2016-04-19 ENCOUNTER — Encounter: Payer: Self-pay | Admitting: Obstetrics & Gynecology

## 2016-04-19 VITALS — BP 108/71 | HR 67 | Resp 16 | Ht 62.0 in | Wt 97.0 lb

## 2016-04-19 DIAGNOSIS — Z01419 Encounter for gynecological examination (general) (routine) without abnormal findings: Secondary | ICD-10-CM | POA: Diagnosis not present

## 2016-04-19 DIAGNOSIS — Z1151 Encounter for screening for human papillomavirus (HPV): Secondary | ICD-10-CM | POA: Diagnosis not present

## 2016-04-19 DIAGNOSIS — Z124 Encounter for screening for malignant neoplasm of cervix: Secondary | ICD-10-CM

## 2016-04-19 LAB — HM PAP SMEAR

## 2016-04-19 NOTE — Progress Notes (Signed)
Subjective:    Molinda Bailiffmily G Wubben is a 30 y.o. MW G0  female who presents for an annual exam. The patient has no complaints today. The patient is sexually active. GYN screening history: last pap: was normal. The patient wears seatbelts: yes. The patient participates in regular exercise: yes. Has the patient ever been transfused or tattooed?: no. The patient reports that there is not domestic violence in her life.   Menstrual History: OB History    Gravida Para Term Preterm AB TAB SAB Ectopic Multiple Living   0               Menarche age: amenorrheic  No LMP recorded. Patient is not currently having periods (Reason: Other).    The following portions of the patient's history were reviewed and updated as appropriate: allergies, current medications, past family history, past medical history, past social history, past surgical history and problem list.  Review of Systems Pertinent items are noted in HPI.  Using condoms. Probably not wanting to have kids.   Objective:    BP 108/71 mmHg  Pulse 67  Resp 16  Ht 5\' 2"  (1.575 m)  Wt 97 lb (43.999 kg)  BMI 17.74 kg/m2  General Appearance:    Alert, cooperative, no distress, appears stated age  Head:    Normocephalic, without obvious abnormality, atraumatic  Eyes:    PERRL, conjunctiva/corneas clear, EOM's intact, fundi    benign, both eyes  Ears:    Normal TM's and external ear canals, both ears  Nose:   Nares normal, septum midline, mucosa normal, no drainage    or sinus tenderness  Throat:   Lips, mucosa, and tongue normal; teeth and gums normal  Neck:   Supple, symmetrical, trachea midline, no adenopathy;    thyroid:  no enlargement/tenderness/nodules; no carotid   bruit or JVD  Back:     Symmetric, no curvature, ROM normal, no CVA tenderness  Lungs:     Clear to auscultation bilaterally, respirations unlabored  Chest Wall:    No tenderness or deformity   Heart:    Regular rate and rhythm, S1 and S2 normal, no murmur, rub   or gallop   Breast Exam:    No tenderness, masses, or nipple abnormality  Abdomen:     Soft, non-tender, bowel sounds active all four quadrants,    no masses, no organomegaly  Genitalia:    Normal female without lesion, discharge or tenderness, NSSA, NT, mobile, bedside u/s shows PCOS type ovaries, very thin uterine lining)     Extremities:   Extremities normal, atraumatic, no cyanosis or edema  Pulses:   2+ and symmetric all extremities  Skin:   Skin color, texture, turgor normal, no rashes or lesions  Lymph nodes:   Cervical, supraclavicular, and axillary nodes normal  Neurologic:   CNII-XII intact, normal strength, sensation and reflexes    throughout  .    Assessment:    Healthy female exam.    Plan:     Thin prep Pap smear.  with cotesting RTC 1 year for annual, pap in 3 years

## 2016-04-19 NOTE — Addendum Note (Signed)
Addended by: Etta GrandchildJONES, Zaniel Marineau L on: 04/19/2016 03:09 PM   Modules accepted: Kipp BroodSmartSet

## 2016-04-23 LAB — CYTOLOGY - PAP

## 2017-01-02 ENCOUNTER — Other Ambulatory Visit: Payer: Self-pay | Admitting: Specialist

## 2017-01-02 ENCOUNTER — Ambulatory Visit
Admission: RE | Admit: 2017-01-02 | Discharge: 2017-01-02 | Disposition: A | Discharge status: Home / Self Care | Payer: BC Managed Care – PPO | Source: Ambulatory Visit | Attending: Specialist | Admitting: Specialist

## 2017-01-02 DIAGNOSIS — M25473 Effusion, unspecified ankle: Secondary | ICD-10-CM

## 2017-02-21 ENCOUNTER — Telehealth: Payer: Self-pay

## 2017-02-21 NOTE — Telephone Encounter (Signed)
Dr. Marice Potter asked me to make appt for this pt to come in for a blood draw (TSH, Free T3 & T4). PT did not answer phone so I left a message asking her to call office to make this appt.

## 2017-02-26 ENCOUNTER — Other Ambulatory Visit (INDEPENDENT_AMBULATORY_CARE_PROVIDER_SITE_OTHER): Payer: BC Managed Care – PPO

## 2017-02-26 DIAGNOSIS — R946 Abnormal results of thyroid function studies: Secondary | ICD-10-CM

## 2017-02-26 DIAGNOSIS — R7989 Other specified abnormal findings of blood chemistry: Secondary | ICD-10-CM

## 2017-02-27 ENCOUNTER — Telehealth: Payer: Self-pay | Admitting: *Deleted

## 2017-02-27 ENCOUNTER — Other Ambulatory Visit: Payer: BC Managed Care – PPO

## 2017-02-27 LAB — T4, FREE: FREE T4: 1 ng/dL (ref 0.8–1.8)

## 2017-02-27 LAB — T3, FREE: T3, Free: 2.8 pg/mL (ref 2.3–4.2)

## 2017-02-27 LAB — TSH: TSH: 1.52 m[IU]/L

## 2017-02-27 NOTE — Telephone Encounter (Signed)
LM on voicemail of normal TSH , Free T3 and 4

## 2017-03-12 ENCOUNTER — Telehealth: Payer: Self-pay | Admitting: Internal Medicine

## 2017-03-12 NOTE — Telephone Encounter (Signed)
Pt would like to be reestablished.

## 2017-03-13 NOTE — Telephone Encounter (Signed)
yes

## 2017-04-10 ENCOUNTER — Ambulatory Visit (INDEPENDENT_AMBULATORY_CARE_PROVIDER_SITE_OTHER): Payer: BC Managed Care – PPO | Admitting: Family Medicine

## 2017-04-10 ENCOUNTER — Encounter: Payer: Self-pay | Admitting: Family Medicine

## 2017-04-10 ENCOUNTER — Telehealth: Payer: Self-pay | Admitting: Internal Medicine

## 2017-04-10 VITALS — BP 122/72 | HR 61 | Temp 98.5°F | Ht 62.0 in | Wt 112.4 lb

## 2017-04-10 DIAGNOSIS — M25571 Pain in right ankle and joints of right foot: Secondary | ICD-10-CM | POA: Diagnosis not present

## 2017-04-10 DIAGNOSIS — R635 Abnormal weight gain: Secondary | ICD-10-CM

## 2017-04-10 NOTE — Progress Notes (Signed)
Casey Glenn is a 31 y.o. female [facility]   Patient Care Team: Etta GrandchildJones, Thomas L, MD as PCP - General (Internal Medicine)   History of Present Illness:   Joseph ArtAmber Agner, CMA, acting as scribe for Dr. Earlene PlaterWallace.  CC:  Patient states she began having pain in her right lower leg acutely while running a few weeks ago.  States she has tried ice, heat, and foam rolling for the problem.  About a week ago, patient began having pain in her left leg as well.  She has stopped running due to her symptoms.    Leg Pain   The incident occurred more than 1 week ago. The pain is present in the right leg and left leg. The quality of the pain is described as aching and cramping. The pain is moderate. The pain has been intermittent since onset. She reports no foreign bodies present. The symptoms are aggravated by weight bearing. She has tried ice, heat and immobilization for the symptoms. The treatment provided mild relief.   Weight gain Increased 15 pounds in the past year without change to diet or regimen. Medications have been stable. Endocrine ROS: negative for - breast changes, galactorrhea, hair pattern changes, hot flashes, malaise/lethargy, mood swings, palpitations, polydipsia/polyuria, skin changes or temperature intolerance. No concern for pregnancy.  Health Maintenance Due  Topic Date Due  . HIV Screening  07/09/2000  . TETANUS/TDAP  07/09/2004   Immunization History  Administered Date(s) Administered  . Influenza Whole 09/03/2012  . Influenza-Unspecified 07/28/2014, 08/12/2015, 08/10/2016  . PPD Test 05/23/2012   PMHx, SurgHx, SocialHx, Medications, and Allergies were reviewed in the Visit Navigator and updated as appropriate.   Past Medical History:  Diagnosis Date  . Amenorrhea following discontinuation of oral contraceptive use    none since 2013  . Bipolar 1 disorder [facility](HCC)    Past Surgical History:  Procedure Laterality Date  . WISDOM TOOTH EXTRACTION     Family  History  Problem Relation Age of Onset  . Hyperlipidemia Maternal Grandmother   . Hyperlipidemia Paternal Grandmother    Social History  Substance Use Topics  . Smoking status: Never Smoker  . Smokeless tobacco: Never Used  . Alcohol use Yes     Comment: Rarely    Current Medications and Allergies:   .  buPROPion (WELLBUTRIN XL) 150 MG 24 hr tablet, , Disp: , Rfl:  .  Calcium Carb-Cholecalciferol (CALCIUM 500/D) 500-400 MG-UNIT CHEW, Chew by mouth., Disp: , Rfl:  .  ferrous sulfate 325 (65 FE) MG EC tablet, Take 325 mg by mouth 3 (three) times daily with meals., Disp: , Rfl:  .  lamoTRIgine (LAMICTAL) 25 MG tablet, Take 125 mg by mouth daily. , Disp: , Rfl:  .  LORazepam (ATIVAN) 1 MG tablet, , Disp: , Rfl:  .  MELATONIN PO, Take by mouth., Disp: , Rfl:  .  Multiple Vitamins-Minerals (MULTIVITAMIN ADULT PO), Take by mouth., Disp: , Rfl:    No Known Allergies   Review of Systems:   Review of Systems  Constitutional: Negative for chills, fever, malaise/fatigue and weight loss.  Respiratory: Negative for cough, shortness of breath and wheezing.   Cardiovascular: Negative for chest pain, palpitations and leg swelling.  Gastrointestinal: Negative for abdominal pain, constipation, diarrhea, nausea and vomiting.  Genitourinary: Negative for dysuria and urgency.  Musculoskeletal: Positive for joint pain. Negative for myalgias.  Skin: Negative for rash.  Neurological: Negative for dizziness and headaches.  Psychiatric/Behavioral: Negative for depression, substance  abuse and suicidal ideas. The patient is not nervous/anxious.    Vitals:   Vitals:   04/10/17 1135  BP: 122/72  Pulse: 61  Temp: 98.5 F (36.9 C)  TempSrc: Oral  SpO2: 100%  Weight: 112 lb 6.4 oz (51 kg)  Height: 5\' 2"  (1.575 m)     Body mass index is 20.56 kg/m.  Physical Exam:   Physical Exam  Constitutional: She appears well-developed and well-nourished. No distress.  HENT:  Head: Normocephalic and  atraumatic.  Eyes: EOM are normal. Pupils are equal, round, and reactive to light.  Neck: Normal range of motion. Neck supple.  Cardiovascular: Normal rate, regular rhythm, normal heart sounds and intact distal pulses.   Pulmonary/Chest: Effort normal.  Abdominal: Soft.  Musculoskeletal:       Legs: Point ttp interosseous tib/fib connection right leg. Normal ROM. No edema or obvious deformity.   Skin: Skin is warm.  Psychiatric: She has a normal mood and affect. Her behavior is normal.  Nursing note and vitals reviewed.   Assessment and Plan:   Laurene was seen today for establish care and leg pain.  Diagnoses and all orders for this visit:  Acute right ankle pain Comments: Acute. Possible tear of insterosseous membrane. Brace provided. Exercises reviewed. Follow up with Berline Chough in 2-3 weeks.   Weight gain Comments: Labs pending. Will check for metabolic syndrome associated with medications.  Orders: -     CBC with Differential/Platelet; Future -     Comprehensive metabolic panel; Future -     Insulin, Free (Bioactive); Future    . Reviewed expectations re: course of current medical issues. . Discussed self-management of symptoms. . Outlined signs and symptoms indicating need for more acute intervention. . Patient verbalized understanding and all questions were answered. Marland Kitchen Health Maintenance issues including appropriate healthy diet, exercise, and smoking avoidance were discussed with patient. . See orders for this visit as documented in the electronic medical record. . Patient received an After Visit Summary.  CMA served as Neurosurgeon during this visit. History, Physical, and Plan performed by medical provider. The above documentation has been reviewed and is accurate and complete. Helane Rima, D.O.  Helane Rima, DO [place] [place] 04/13/2017  Future Appointments Date Time Provider Department Center  04/17/2017 8:15 AM [facility] LAB [facility] None  04/19/2017 1:30  PM Andrena Mews, DO [facility] None

## 2017-04-10 NOTE — Telephone Encounter (Signed)
called and left vmail to provide facility for us to request medical records   -LL

## 2017-04-17 ENCOUNTER — Telehealth: Payer: Self-pay | Admitting: Family Medicine

## 2017-04-17 ENCOUNTER — Other Ambulatory Visit (INDEPENDENT_AMBULATORY_CARE_PROVIDER_SITE_OTHER): Payer: BC Managed Care – PPO

## 2017-04-17 ENCOUNTER — Telehealth: Payer: Self-pay | Admitting: Sports Medicine

## 2017-04-17 DIAGNOSIS — R635 Abnormal weight gain: Secondary | ICD-10-CM | POA: Diagnosis not present

## 2017-04-17 LAB — COMPREHENSIVE METABOLIC PANEL
ALT: 14 U/L (ref 0–35)
AST: 23 U/L (ref 0–37)
Albumin: 4.8 g/dL (ref 3.5–5.2)
Alkaline Phosphatase: 75 U/L (ref 39–117)
BUN: 13 mg/dL (ref 6–23)
CO2: 31 mEq/L (ref 19–32)
Calcium: 10.2 mg/dL (ref 8.4–10.5)
Chloride: 101 mEq/L (ref 96–112)
Creatinine, Ser: 1.04 mg/dL (ref 0.40–1.20)
GFR: 65.36 mL/min (ref >60.00)
Glucose, Bld: 90 mg/dL (ref 70–99)
Potassium: 4.4 mEq/L (ref 3.5–5.1)
Sodium: 138 mEq/L (ref 135–145)
Total Bilirubin: 0.7 mg/dL (ref 0.2–1.2)
Total Protein: 7.4 g/dL (ref 6.0–8.3)

## 2017-04-17 LAB — CBC WITH DIFFERENTIAL/PLATELET
Basophils Absolute: 0.1 10*3/uL (ref 0.0–0.1)
Basophils Relative: 2.3 % (ref 0.0–3.0)
Eosinophils Absolute: 0.1 10*3/uL (ref 0.0–0.7)
Eosinophils Relative: 1.5 % (ref 0.0–5.0)
HCT: 44.7 % (ref 36.0–46.0)
Hemoglobin: 15.3 g/dL — ABNORMAL HIGH (ref 12.0–15.0)
Lymphocytes Relative: 25.8 % (ref 12.0–46.0)
Lymphs Abs: 1.2 10*3/uL (ref 0.7–4.0)
MCHC: 34.1 g/dL (ref 30.0–36.0)
MCV: 85.8 fl (ref 78.0–100.0)
Monocytes Absolute: 0.3 10*3/uL (ref 0.1–1.0)
Monocytes Relative: 6.1 % (ref 3.0–12.0)
Neutro Abs: 3 10*3/uL (ref 1.4–7.7)
Neutrophils Relative %: 64.3 % (ref 43.0–77.0)
Platelets: 268 10*3/uL (ref 150.0–400.0)
RBC: 5.21 Mil/uL — ABNORMAL HIGH (ref 3.87–5.11)
RDW: 14.9 % (ref 11.5–15.5)
WBC: 4.7 10*3/uL (ref 4.0–10.5)

## 2017-04-17 NOTE — Telephone Encounter (Signed)
Patient called to get clarification for what will and will not be covered for her appointment. Patient explained that [employer] advised that if we own our equipment (Xray and Ultrasound, which we do own) then she will not be billed. I explained that she may receive a bill from Radiology for reading the X-ray due to our office not having a radiologist and we could not tell her how much that would be.   Patient said she may ask Dr. Berline Choughigby to not have an X-ray done due to having one done in the past and to avoid paying. No further action required.

## 2017-04-17 NOTE — Telephone Encounter (Signed)
Noted  

## 2017-04-17 NOTE — Addendum Note (Signed)
Addended by: Felix AhmadiFRANSEN, Jayley Hustead A on: 04/17/2017 08:11 AM   Modules accepted: Orders

## 2017-04-17 NOTE — Telephone Encounter (Signed)
error 

## 2017-04-19 ENCOUNTER — Ambulatory Visit (INDEPENDENT_AMBULATORY_CARE_PROVIDER_SITE_OTHER): Payer: BC Managed Care – PPO | Admitting: Sports Medicine

## 2017-04-19 ENCOUNTER — Encounter: Payer: Self-pay | Admitting: Sports Medicine

## 2017-04-19 VITALS — BP 100/70 | HR 77 | Ht 62.0 in | Wt 112.6 lb

## 2017-04-19 DIAGNOSIS — M25571 Pain in right ankle and joints of right foot: Secondary | ICD-10-CM | POA: Diagnosis not present

## 2017-04-19 DIAGNOSIS — R269 Unspecified abnormalities of gait and mobility: Secondary | ICD-10-CM | POA: Diagnosis not present

## 2017-04-19 MED ORDER — DICLOFENAC SODIUM 2 % TD SOLN: 1.0000 "application " | Freq: Two times a day (BID) | TRANSDERMAL | 0 refills | Status: AC

## 2017-04-19 MED ORDER — DICLOFENAC SODIUM 2 % TD SOLN: 1.0000 "application " | Freq: Two times a day (BID) | TRANSDERMAL | 2 refills | Status: DC

## 2017-04-19 NOTE — Progress Notes (Signed)
OFFICE VISIT NOTE Casey Glenn. Casey Glenn [facility] [phone]  Casey Glenn - 31 y.o. female MRN [id number]  Date of birth: 14-Jul-1986  Visit Date: 04/19/2017  PCP: Helane Rima, DO   Referred by: Etta Grandchild, MD  Autumn McNeil,cma acting as scribe for Dr. Berline Chough.  SUBJECTIVE:   Chief Complaint  Patient presents with  . New Patient (Initial Visit)  . Bilateral Ankle Pain   HPI: As below and per problem based documentation when appropriate.  Dietrich reports a "possible" injury after running in [place]. Noticed pain on the lateral side of lower leg above the ankle. Xray was performed at [facility]. Tried an air cast with some relief. After 2 months of changing her exercise routine sx improved but now has returned the past few weeks. Saw Dr. Earlene Plater last week, wearing ankle brace daily with relief. Now sx are in her left leg same area. Tried heat and ice with improvement with ice only. Takes Tylenol with some relief.    Review of Systems  Constitutional: Negative for chills, fever, malaise/fatigue and weight loss.  HENT: Negative.   Eyes: Negative.   Respiratory: Negative.   Cardiovascular: Negative for chest pain and palpitations.  Gastrointestinal: Negative.   Genitourinary: Negative.   Musculoskeletal: Positive for joint pain and myalgias.  Skin: Negative for itching and rash.  Neurological: Negative for dizziness, tingling and headaches.  Endo/Heme/Allergies: Negative for environmental allergies and polydipsia. Does not bruise/bleed easily.  Psychiatric/Behavioral: Negative.     Otherwise per HPI.  HISTORY & PERTINENT PRIOR DATA:  No specialty comments available. She reports that she has never smoked. She has never used smokeless tobacco. No results for input(s): HGBA1C, LABURIC in the last 8760 hours. Medications & Allergies reviewed per EMR Patient Active Problem List   Diagnosis Date Noted  . Acute  right ankle pain 05/06/2017  . PCOS (polycystic ovarian syndrome) 02/23/2014  . Amenorrhea 11/28/2012  . Depression 11/28/2012   Past Medical History:  Diagnosis Date  . Amenorrhea following discontinuation of oral contraceptive use    none since 2013  . Bipolar 1 disorder (HCC)    Family History  Problem Relation Age of Onset  . Hyperlipidemia Maternal Grandmother   . Hyperlipidemia Paternal Grandmother    Past Surgical History:  Procedure Laterality Date  . WISDOM TOOTH EXTRACTION     Social History   Occupational History  . Not on file.   Social History Main Topics  . Smoking status: Never Smoker  . Smokeless tobacco: Never Used  . Alcohol use Yes     Comment: Rarely  . Drug use: No  . Sexual activity: Yes    Birth control/ protection: None, Condom    OBJECTIVE:  VS:  HT:5\' 2"  (157.5 cm)   WT:112 lb 9.6 oz (51.1 kg)  BMI:20.6    BP:100/70  HR:77bpm  TEMP: ( )  RESP:99 % EXAM: Findings:  WDWN, NAD, Non-toxic appearing Alert & appropriately interactive Not depressed or anxious appearing No increased work of breathing. Pupils are equal. EOM intact without nystagmus No clubbing or cyanosis of the extremities appreciated No significant rashes/lesions/ulcerations overlying the examined area. DP & PT pulses 2+/4.  No significant pretibial edema. Sensation intact to light touch in lower extremities.  Bilateral feet and ankles: Overall well aligned.  High cavus foot.  She visible pain with syndesmotic squeeze but overall good full ankle range of motion.  Ankle drawer testing is stable.  Intrinsic  foot and ankle strength is 5+/5.  Gait reveals a rigid landing with slight external rotation.     No results found. ASSESSMENT & PLAN:   Problem List Items Addressed This Visit    Acute right ankle pain - Primary    >50% of this 30 minute visit spent in direct patient counseling and/or coordination of care.  Discussion was focused on education regarding the in  discussing the pathoetiology and anticipated clinical course of the above condition.  Discussion was also around return to activity return to running program.  Ultimately pain is most likely coming from poor biomechanics that would be corrected with insoles.  Begin with OTC options and consider custom cushion orthotics in the future.  Also consider compression in this area and strategies for this were discussed.      Relevant Medications   Diclofenac Sodium (PENNSAID) 2 % SOLN    Other Visit Diagnoses    Gait disturbance          Follow-up: Return in about 6 weeks (around 05/31/2017).   CMA/ATC served as Neurosurgeonscribe during this visit. History, Physical, and Plan performed by medical provider. Documentation and orders reviewed and attested to.      Gaspar BiddingMichael Rigby, DO    [facility] Phys[facility]

## 2017-04-19 NOTE — Progress Notes (Signed)
Verbal order received for patient to receive sample medication. Patient teaching completed by provider.   

## 2017-04-19 NOTE — Patient Instructions (Signed)
It was good to see you.  Try increasing your running with 2 minutes of running with 1 minute of walking starting off at 20 minutes this week.  Had 5 minutes per week and work up to 5 minutes of running with 1 minute of walking.  At that point you should be able to resume running normally.  I recommend that you obtain over-the-counter SOLE  medium cushioned insoles.  These can be found at [employer] - or on-line at [employer]  Search for "SOLE Active Medium Shoe Insoles"  I also recommend you obtain compression socks and try running in the 80s.  You could also consider taping athletic tape around the upper part of your ankle to see if this is beneficial.

## 2017-04-21 LAB — INSULIN, FREE (BIOACTIVE): Insulin, Free: 1.6 u[IU]/mL (ref 1.5–14.9)

## 2017-04-22 ENCOUNTER — Ambulatory Visit: Payer: BC Managed Care – PPO | Admitting: Family Medicine

## 2017-05-06 DIAGNOSIS — M25571 Pain in right ankle and joints of right foot: Secondary | ICD-10-CM | POA: Insufficient documentation

## 2017-05-06 NOTE — Assessment & Plan Note (Signed)
>  50% of this 30 minute visit spent in direct patient counseling and/or coordination of care.  Discussion was focused on education regarding the in discussing the pathoetiology and anticipated clinical course of the above condition.  Discussion was also around return to activity return to running program.  Ultimately pain is most likely coming from poor biomechanics that would be corrected with insoles.  Begin with OTC options and consider custom cushion orthotics in the future.  Also consider compression in this area and strategies for this were discussed.

## 2017-05-09 ENCOUNTER — Encounter: Payer: Self-pay | Admitting: Family Medicine

## 2017-05-29 ENCOUNTER — Encounter: Payer: Self-pay | Admitting: Obstetrics & Gynecology

## 2017-05-29 ENCOUNTER — Ambulatory Visit (INDEPENDENT_AMBULATORY_CARE_PROVIDER_SITE_OTHER): Payer: BC Managed Care – PPO | Admitting: Obstetrics & Gynecology

## 2017-05-29 VITALS — BP 98/58 | HR 56 | Resp 16 | Ht 62.0 in | Wt 108.0 lb

## 2017-05-29 DIAGNOSIS — Z01419 Encounter for gynecological examination (general) (routine) without abnormal findings: Secondary | ICD-10-CM

## 2017-05-29 NOTE — Progress Notes (Signed)
Subjective:    Casey Glenn is a 31 y.o. MW female who presents for an annual exam. The patient has no complaints today. The patient is sexually active. GYN screening history: last pap: was normal. The patient wears seatbelts: yes. The patient participates in regular exercise: yes. Has the patient ever been transfused or tattooed?: no. The patient reports that there is not domestic violence in her life.   Menstrual History: OB History    Gravida Para Term Preterm AB Living   0             SAB TAB Ectopic Multiple Live Births                  Menarche age: [age] No LMP recorded. Patient is not currently having periods (Reason: Other).    The following portions of the patient's history were reviewed and updated as appropriate: allergies, current medications, past family history, past medical history, past social history, past surgical history and problem list.  Review of Systems Pertinent items are noted in HPI.   She is amenorrheic. We have done a work up. She gets annual u/s s here at the office to make sure that her uterine lining is thin. She has tried OCPs and cyclic progestins, but these wreak havoc with her bipolar disorder.  Married for 7 years, uses condoms FH- No breast/gyn/colon cancer Father is in [place] as a urologist Sees Casey [name], MD at [facility] She also has a therapist.   Objective:    There were no vitals taken for this visit.  General Appearance:    Alert, cooperative, no distress, appears stated age  Head:    Normocephalic, without obvious abnormality, atraumatic  Eyes:    PERRL, conjunctiva/corneas clear, EOM's intact, fundi    benign, both eyes  Ears:    Normal TM's and external ear canals, both ears  Nose:   Nares normal, septum midline, mucosa normal, no drainage    or sinus tenderness  Throat:   Lips, mucosa, and tongue normal; teeth and gums normal  Neck:   Supple, symmetrical, trachea midline, no adenopathy;    thyroid:  no  enlargement/tenderness/nodules; no carotid   bruit or JVD  Back:     Symmetric, no curvature, ROM normal, no CVA tenderness  Lungs:     Clear to auscultation bilaterally, respirations unlabored  Chest Wall:    No tenderness or deformity   Heart:    Regular rate and rhythm, S1 and S2 normal, no murmur, rub   or gallop  Breast Exam:    No tenderness, masses, or nipple abnormality  Abdomen:     Soft, non-tender, bowel sounds active all four quadrants,    no masses, no organomegaly  Genitalia:    Normal female without lesion, discharge or tenderness, NSSA, NT, no adnexal masses or tenderness     Extremities:   Extremities normal, atraumatic, no cyanosis or edema  Pulses:   2+ and symmetric all extremities  Skin:   Skin color, texture, turgor normal, no rashes or lesions  Lymph nodes:   Cervical, supraclavicular, and axillary nodes normal  Neurologic:   CNII-XII intact, normal strength, sensation and reflexes    throughout  .  Bedside u/s shows a 1.4 cm uterine lining, no adnexal masses   Assessment:    Healthy female exam.   amenorrhea   Plan:     Thin prep Pap smear. with cotesting Continue annual u/s since she cannot be cycled with hormones

## 2017-05-31 LAB — CYTOLOGY - PAP
Diagnosis: NEGATIVE
HPV: NOT DETECTED

## 2017-06-04 ENCOUNTER — Ambulatory Visit: Payer: BC Managed Care – PPO | Admitting: Sports Medicine

## 2017-09-30 ENCOUNTER — Encounter (HOSPITAL_COMMUNITY): Payer: Self-pay | Admitting: Emergency Medicine

## 2017-09-30 ENCOUNTER — Emergency Department (HOSPITAL_COMMUNITY)
Admission: EM | Admit: 2017-09-30 | Discharge: 2017-09-30 | Disposition: A | Discharge status: Other Acute Inpatient Hospital | Payer: BC Managed Care – PPO | Attending: Emergency Medicine | Admitting: Emergency Medicine

## 2017-09-30 ENCOUNTER — Other Ambulatory Visit: Payer: Self-pay

## 2017-09-30 ENCOUNTER — Encounter (HOSPITAL_COMMUNITY): Payer: Self-pay | Admitting: *Deleted

## 2017-09-30 ENCOUNTER — Inpatient Hospital Stay (HOSPITAL_COMMUNITY)
Admission: AD | Admit: 2017-09-30 | Discharge: 2017-10-04 | DRG: 885 | Disposition: A | Discharge status: Home / Self Care | Payer: BC Managed Care – PPO | Source: Intra-hospital | Attending: Psychiatry | Admitting: Psychiatry

## 2017-09-30 DIAGNOSIS — F17210 Nicotine dependence, cigarettes, uncomplicated: Secondary | ICD-10-CM | POA: Diagnosis not present

## 2017-09-30 DIAGNOSIS — R45851 Suicidal ideations: Secondary | ICD-10-CM | POA: Insufficient documentation

## 2017-09-30 DIAGNOSIS — X58XXXA Exposure to other specified factors, initial encounter: Secondary | ICD-10-CM | POA: Diagnosis present

## 2017-09-30 DIAGNOSIS — F319 Bipolar disorder, unspecified: Secondary | ICD-10-CM | POA: Diagnosis present

## 2017-09-30 DIAGNOSIS — T43296A Underdosing of other antidepressants, initial encounter: Secondary | ICD-10-CM | POA: Diagnosis present

## 2017-09-30 DIAGNOSIS — Z91128 Patient's intentional underdosing of medication regimen for other reason: Secondary | ICD-10-CM

## 2017-09-30 DIAGNOSIS — Z915 Personal history of self-harm: Secondary | ICD-10-CM | POA: Diagnosis not present

## 2017-09-30 DIAGNOSIS — F329 Major depressive disorder, single episode, unspecified: Secondary | ICD-10-CM | POA: Insufficient documentation

## 2017-09-30 DIAGNOSIS — F4001 Agoraphobia with panic disorder: Secondary | ICD-10-CM | POA: Diagnosis present

## 2017-09-30 DIAGNOSIS — G4700 Insomnia, unspecified: Secondary | ICD-10-CM | POA: Diagnosis present

## 2017-09-30 DIAGNOSIS — Z818 Family history of other mental and behavioral disorders: Secondary | ICD-10-CM | POA: Diagnosis not present

## 2017-09-30 DIAGNOSIS — Z79899 Other long term (current) drug therapy: Secondary | ICD-10-CM | POA: Diagnosis not present

## 2017-09-30 DIAGNOSIS — F419 Anxiety disorder, unspecified: Secondary | ICD-10-CM | POA: Insufficient documentation

## 2017-09-30 DIAGNOSIS — F314 Bipolar disorder, current episode depressed, severe, without psychotic features: Principal | ICD-10-CM | POA: Diagnosis present

## 2017-09-30 DIAGNOSIS — F39 Unspecified mood [affective] disorder: Secondary | ICD-10-CM | POA: Diagnosis not present

## 2017-09-30 DIAGNOSIS — Z046 Encounter for general psychiatric examination, requested by authority: Secondary | ICD-10-CM | POA: Insufficient documentation

## 2017-09-30 DIAGNOSIS — R450 Nervousness: Secondary | ICD-10-CM | POA: Diagnosis not present

## 2017-09-30 LAB — COMPREHENSIVE METABOLIC PANEL
ALT: 14 U/L (ref 14–54)
AST: 26 U/L (ref 15–41)
Albumin: 4.2 g/dL (ref 3.5–5.0)
Alkaline Phosphatase: 53 U/L (ref 38–126)
Anion gap: 6 (ref 5–15)
BUN: 11 mg/dL (ref 6–20)
CO2: 28 mmol/L (ref 22–32)
Calcium: 9.8 mg/dL (ref 8.9–10.3)
Chloride: 104 mmol/L (ref 101–111)
Creatinine, Ser: 0.91 mg/dL (ref 0.44–1.00)
GFR calc Af Amer: >60 mL/min (ref >60)
GFR calc non Af Amer: >60 mL/min (ref >60)
Glucose, Bld: 92 mg/dL (ref 65–99)
Potassium: 4.1 mmol/L (ref 3.5–5.1)
Sodium: 138 mmol/L (ref 135–145)
Total Bilirubin: 0.9 mg/dL (ref 0.3–1.2)
Total Protein: 7 g/dL (ref 6.5–8.1)

## 2017-09-30 LAB — RAPID URINE DRUG SCREEN, HOSP PERFORMED
Amphetamines: NOT DETECTED
Barbiturates: NOT DETECTED
Benzodiazepines: POSITIVE — AB
Cocaine: NOT DETECTED
Opiates: NOT DETECTED
Tetrahydrocannabinol: NOT DETECTED

## 2017-09-30 LAB — I-STAT BETA HCG BLOOD, ED (MC, WL, AP ONLY): I-stat hCG, quantitative: <5 m[IU]/mL (ref <5)

## 2017-09-30 LAB — CBC
HCT: 42 % (ref 36.0–46.0)
Hemoglobin: 14.8 g/dL (ref 12.0–15.0)
MCH: 31 pg (ref 26.0–34.0)
MCHC: 35.2 g/dL (ref 30.0–36.0)
MCV: 88.1 fL (ref 78.0–100.0)
Platelets: 171 10*3/uL (ref 150–400)
RBC: 4.77 MIL/uL (ref 3.87–5.11)
RDW: 13.2 % (ref 11.5–15.5)
WBC: 6.2 10*3/uL (ref 4.0–10.5)

## 2017-09-30 LAB — SALICYLATE LEVEL: Salicylate Lvl: <7 mg/dL (ref 2.8–30.0)

## 2017-09-30 LAB — ETHANOL: Alcohol, Ethyl (B): <10 mg/dL (ref <10)

## 2017-09-30 LAB — ACETAMINOPHEN LEVEL: Acetaminophen (Tylenol), Serum: <10 ug/mL — ABNORMAL LOW (ref 10–30)

## 2017-09-30 MED ORDER — TRAZODONE HCL 50 MG PO TABS: 50.0000 mg | ORAL_TABLET | Freq: Every evening | ORAL | Status: DC | PRN

## 2017-09-30 MED ORDER — HYDROXYZINE HCL 25 MG PO TABS
25.0000 mg | ORAL_TABLET | Freq: Three times a day (TID) | ORAL | Status: DC | PRN
  Administered 2017-10-02 (×2): 25 mg via ORAL
  Filled 2017-09-30 (×2): qty 1

## 2017-09-30 MED ORDER — ALUM & MAG HYDROXIDE-SIMETH 200-200-20 MG/5ML PO SUSP
30.0000 mL | Freq: Four times a day (QID) | ORAL | Status: DC | PRN
Start: 2017-09-30 — End: 2017-09-30

## 2017-09-30 MED ORDER — IBUPROFEN 200 MG PO TABS: 600.0000 mg | ORAL_TABLET | Freq: Three times a day (TID) | ORAL | Status: DC | PRN

## 2017-09-30 MED ORDER — ACETAMINOPHEN 325 MG PO TABS: 650.0000 mg | ORAL_TABLET | Freq: Four times a day (QID) | ORAL | Status: DC | PRN

## 2017-09-30 MED ORDER — ALUM & MAG HYDROXIDE-SIMETH 200-200-20 MG/5ML PO SUSP: 30.0000 mL | ORAL | Status: DC | PRN

## 2017-09-30 MED ORDER — MAGNESIUM HYDROXIDE 400 MG/5ML PO SUSP: 30.0000 mL | Freq: Every day | ORAL | Status: DC | PRN

## 2017-09-30 MED ORDER — LORAZEPAM 1 MG PO TABS
1.0000 mg | ORAL_TABLET | Freq: Once | ORAL | Status: AC
  Administered 2017-09-30: 1 mg via ORAL
  Filled 2017-09-30: qty 1

## 2017-09-30 MED ORDER — TRAZODONE HCL 50 MG PO TABS: 50.0000 mg | ORAL_TABLET | Freq: Every day | ORAL | Status: DC

## 2017-09-30 MED ORDER — HYDROXYZINE HCL 25 MG PO TABS
50.0000 mg | ORAL_TABLET | Freq: Three times a day (TID) | ORAL | Status: DC
Start: 2017-09-30 — End: 2017-09-30
  Administered 2017-09-30: 50 mg via ORAL
  Filled 2017-09-30: qty 2

## 2017-09-30 MED ORDER — ONDANSETRON HCL 4 MG PO TABS: 4.0000 mg | ORAL_TABLET | Freq: Three times a day (TID) | ORAL | Status: DC | PRN

## 2017-09-30 MED ORDER — ZOLPIDEM TARTRATE 5 MG PO TABS: 5.0000 mg | ORAL_TABLET | Freq: Every evening | ORAL | Status: DC | PRN
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
## 2017-09-30 NOTE — ED Notes (Signed)
Pt admitted to room #42. Sad affect, depressed. 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 Pt reports increase worry and  identifies the recent [place], [place] a stressor. Pt expressing hopelessness.Pt reports taking ativan yesterday throughout the day. Pt reports she also wanted to take a whole bunch of pills this morning, but her husband stopped her. "I wanted the pain to go away."  Pt reports hx of bipolar disorder. Encouragement and support provided. Special checks q 15 mins in place for safety, Video monitoring in place. Will continue to monitor.

## 2017-09-30 NOTE — ED Provider Notes (Signed)
[facility]-EMERGENCY DEPT Provider Note   CSN: [id number] Arrival date & time: 09/30/17  1138     History   Chief Complaint Chief Complaint  Patient presents with  . Suicidal  . Drug Overdose    HPI Casey Glenn is a 31 y.o. female.  HPI Patient presents to the emergency department with suicidal ideation.  The patient states that on Friday she started getting what she felt more depressed and then Saturday morning she had gotten a bunch of pills and was going to take them but then called her husband who came home.  She states that she then was at her friend's house for the rest the day and her husband brought her home.  The patient states that yesterday she thought about taking more pills so she took 12 Ativan along with 7 Ambien.  Patient states that she felt like this would not kill her but thought maybe would help with her pain the patient states that nothing seems to make her condition better or worse.  The patient states she has not had any hallucinations, nausea, vomiting, diarrhea, headache, blurred vision, weakness, numbness, chest pain, shortness of breath, abdominal pain, fever, near-syncope, or syncope. Past Medical History:  Diagnosis Date  . Amenorrhea following discontinuation of oral contraceptive use    none since 2013  . Bipolar 1 disorder [facility](HCC)     Patient Active Problem List   Diagnosis Date Noted  . Acute right ankle pain 05/06/2017  . PCOS (polycystic ovarian syndrome) 02/23/2014  . Amenorrhea 11/28/2012  . Depression 11/28/2012    Past Surgical History:  Procedure Laterality Date  . WISDOM TOOTH EXTRACTION      OB History    Gravida Para Term Preterm AB Living   0             SAB TAB Ectopic Multiple Live Births                   Home Medications    Prior to Admission medications   Medication Sig Start Date End Date Taking? Authorizing Provider  CALCIUM-MAGNESIUM-ZINC PO Take 1 tablet by mouth daily.   Yes [provider]  ferrous sulfate 325 (65 FE) MG EC tablet Take 325 mg by mouth daily with breakfast.    Yes [provider]  lamoTRIgine (LAMICTAL) 25 MG tablet Take 100 mg by mouth daily.  03/15/16  Yes [provider]  LORazepam (ATIVAN) 1 MG tablet Take 1 mg by mouth every 6 (six) hours as needed for anxiety or sleep.  03/30/16  Yes [provider]  MELATONIN PO Take 9 mg by mouth at bedtime as needed (for sleep).    Yes [provider]  Multiple Vitamins-Minerals (MULTIVITAMIN ADULT PO) Take 1 tablet by mouth daily.    Yes [provider]  zolpidem (AMBIEN) 10 MG tablet Take 10 mg by mouth at bedtime as needed for sleep.   Yes [provider]  buPROPion (WELLBUTRIN XL) 150 MG 24 hr tablet  04/18/16   [provider]  Diclofenac Sodium (PENNSAID) 2 % SOLN Place 1 application onto the skin 2 (two) times daily. Patient not taking: Reported on 09/30/2017 04/19/17   Andrena Mewsigby, Michael D, DO    Family History Family History  Problem Relation Age of Onset  . Hyperlipidemia Maternal Grandmother   . Hyperlipidemia Paternal Grandmother     Social History Social History   Tobacco Use  . Smoking status: Never Smoker  .  Smokeless tobacco: Never Used  Substance Use Topics  . Alcohol use: Yes    Comment: Rarely  . Drug use: No     Allergies   Patient has no known allergies.   Review of Systems Review of Systems All other systems negative except as documented in the HPI. All pertinent positives and negatives as reviewed in the HPI.  Physical Exam Updated Vital Signs BP 117/79   Pulse 74   Temp 98.1 F (36.7 C) (Oral)   Resp 16   SpO2 100%   Physical Exam  Constitutional: She is oriented to person, place, and time. She appears well-developed and well-nourished. No distress.  HENT:  Head: Normocephalic and atraumatic.  Mouth/Throat: Oropharynx is clear and moist.  Eyes: Pupils are equal, round, and reactive to light.    Neck: Normal range of motion. Neck supple.  Cardiovascular: Normal rate, regular rhythm and normal heart sounds. Exam reveals no gallop and no friction rub.  No murmur heard. Pulmonary/Chest: Effort normal and breath sounds normal. No respiratory distress. She has no wheezes.  Neurological: She is alert and oriented to person, place, and time. She exhibits normal muscle tone. Coordination normal.  Skin: Skin is warm and dry. Capillary refill takes less than 2 seconds. No rash noted. No erythema.  Psychiatric: Her behavior is normal. Her mood appears anxious. She exhibits a depressed mood. She expresses suicidal ideation. She expresses suicidal plans.  Nursing note and vitals reviewed.    ED Treatments / Results  Labs (all labs ordered are listed, but only abnormal results are displayed) Labs Reviewed  ACETAMINOPHEN LEVEL - Abnormal; Notable for the following components:      Result Value   Acetaminophen (Tylenol), Serum <10 (*)    All other components within normal limits  RAPID URINE DRUG SCREEN, HOSP PERFORMED - Abnormal; Notable for the following components:   Benzodiazepines POSITIVE (*)    All other components within normal limits  COMPREHENSIVE METABOLIC PANEL  ETHANOL  SALICYLATE LEVEL  CBC  I-STAT BETA HCG BLOOD, ED ([facility], [facility], [facility] ONLY)    EKG  EKG Interpretation None       Radiology No results found.  Procedures Procedures (including critical care time)  Medications Ordered in ED Medications  ondansetron (ZOFRAN) tablet 4 mg (not administered)  alum & mag hydroxide-simeth (MAALOX/MYLANTA) 200-200-20 MG/5ML suspension 30 mL (not administered)  ibuprofen (ADVIL,MOTRIN) tablet 600 mg (not administered)  zolpidem (AMBIEN) tablet 5 mg (not administered)     Initial Impression / Assessment and Plan / ED Course  I have reviewed the triage vital signs and the nursing notes.  Pertinent labs & imaging results that were available during my care of the patient were  reviewed by me and considered in my medical decision making (see chart for details).     Patient will need TTS assessment for her suicidal attempt and ideation.  Patient is advised the plan and all questions were answered.   Final Clinical Impressions(s) / ED Diagnoses   Final diagnoses:  Suicidal ideation  Suicidal thoughts    ED Discharge Orders    None       Charlestine NightLawyer, Emelda Kohlbeck, PA-C 09/30/17 1622    Raeford RazorKohut, Stephen, MD 10/01/17 [id number]

## 2017-09-30 NOTE — BH Assessment (Signed)
[facility] Assessment Progress Note  Case was staffed with Arville CareParks FNP who recommended a inpatient admission. [facility] -[facility] 1900 hours. AC to coordinate.

## 2017-09-30 NOTE — Progress Notes (Signed)
09/30/17 1400:  LRT introduced self to pt.  Pt was lying down but awake.  Pt seemed timid and scary.  Pt was pulling her covers up and was somewhat hiding behind them.  Pt declined activities.    Caroll RancherMarjette Taffie Eckmann, LRT/CTRS

## 2017-09-30 NOTE — ED Notes (Signed)
Patient endorse SI with a plan to take her life by overdosing. Patient denies HI/AVH at this time. Plan of care discussed. Patient is able to contract for safety while on unit. Encouragement and support provided and safety maintain. Q 15 min safety checks remain in place and video monitoring.

## 2017-09-30 NOTE — Tx Team (Signed)
Initial Treatment Plan 09/30/2017 9:51 PM Rogene HoustonEmily Golda Krone ZOX:[id number]    PATIENT STRESSORS: Occupational concerns   PATIENT STRENGTHS: Average or above average intelligence Capable of independent living Communication skills General fund of knowledge Physical Health Supportive family/friends Work skills   PATIENT IDENTIFIED PROBLEMS: SI  depression    "start appreciating my life again"                DISCHARGE CRITERIA:  Improved stabilization in mood, thinking, and/or behavior Motivation to continue treatment in a less acute level of care  PRELIMINARY DISCHARGE PLAN: Outpatient therapy Return to previous living arrangement  PATIENT/FAMILY INVOLVEMENT: This treatment plan has been presented to and reviewed with the patient, Rogene Houstonmily Golda Art.  The patient and family have been given the opportunity to ask questions and make suggestions.  Arrie AranChurch, Ballard Budney J, [place]RN 09/30/2017, 9:51 PM

## 2017-09-30 NOTE — ED Triage Notes (Signed)
Pt reports she has been off her medications for bipolar, and has been under a lot of stress due to her job and the holidays. Pt reports she took 6-7 ambien tablets last night, and 12 ativan tablets throughout the day yesterday. Also took regular medications this am.  Pt complains of a HA.  Pt alert and oriented.

## 2017-09-30 NOTE — Progress Notes (Signed)
Pt is a 31 year old female [facility] for suicide attempt by overdosing on Ativan and Ambien.  She reports she is here because "I tried to kill myself, harm myself."  Pt presents with depressed affect and mood.  Her speech is slow, she maintains fair eye contact.  Pt denies alcohol, tobacco, and drug use.  Denies history of abuse.  Unsure of last menstrual period, pt states "I don't get them."  Pt is vegan.  She endorses SI during assessment; verbally contracts for safety.  Denies HI, denies hallucinations, denies pain.  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  Actively listened to pt and offered support and encouragement.  Admission process and paperwork completed with pt.  Non-invasive body assessment completed by female RN, unremarkable.  Pt oriented to unit and unit routine.  She requests Ativan and Ambien for anxiety and sleep respectively.  On-site provider notified and Ativan 1 mg POX1 was ordered and administered.  Pt is on Q15 minute safety checks.    Pt is cooperative with admission process.  Compliant with medication.  She verbally contracts for safety and reports she will inform staff of needs and concerns.  Will continue to monitor and assess.

## 2017-09-30 NOTE — ED Notes (Signed)
Bed: [facility]3 Expected date:  Expected time:  Means of arrival:  Comments: 

## 2017-09-30 NOTE — ED Notes (Signed)
Pt transported to [facility] by [facility] transportation service for continuation of specialized care. Belongings given to driver after patient signed for them. Pt left in no acute distress. 

## 2017-09-30 NOTE — ED Notes (Signed)
Visitor at bedside.

## 2017-09-30 NOTE — ED Notes (Signed)
Bed: [facility]42 Expected date:  Expected time:  Means of arrival:  Comments: Hold for triage 3 

## 2017-10-01 DIAGNOSIS — F314 Bipolar disorder, current episode depressed, severe, without psychotic features: Principal | ICD-10-CM

## 2017-10-01 DIAGNOSIS — Z818 Family history of other mental and behavioral disorders: Secondary | ICD-10-CM

## 2017-10-01 DIAGNOSIS — F17210 Nicotine dependence, cigarettes, uncomplicated: Secondary | ICD-10-CM

## 2017-10-01 MED ORDER — LORAZEPAM 0.5 MG PO TABS
0.5000 mg | ORAL_TABLET | Freq: Four times a day (QID) | ORAL | Status: DC | PRN
  Administered 2017-10-01 – 2017-10-04 (×5): 0.5 mg via ORAL
  Filled 2017-10-01 (×6): qty 1

## 2017-10-01 MED ORDER — ZOLPIDEM TARTRATE 5 MG PO TABS
5.0000 mg | ORAL_TABLET | Freq: Every evening | ORAL | Status: DC | PRN
  Administered 2017-10-02 – 2017-10-03 (×2): 5 mg via ORAL
  Filled 2017-10-01 (×2): qty 1

## 2017-10-01 MED ORDER — BUPROPION HCL ER (XL) 150 MG PO TB24
150.0000 mg | ORAL_TABLET | Freq: Every day | ORAL | Status: DC
  Administered 2017-10-02: 150 mg via ORAL
  Filled 2017-10-01 (×5): qty 1

## 2017-10-01 MED ORDER — LAMOTRIGINE 25 MG PO TABS
50.0000 mg | ORAL_TABLET | Freq: Every day | ORAL | Status: DC
  Administered 2017-10-01 – 2017-10-04 (×4): 50 mg via ORAL
  Filled 2017-10-01 (×6): qty 2

## 2017-10-01 NOTE — Plan of Care (Signed)
Pt remains a low fall risk, passive for SI but verbal contracts for safety.

## 2017-10-01 NOTE — [facility] Suicide Risk Assessment (Signed)
[facility]   Nursing information obtained from:   patient and chart  Demographic factors:   31 year old married female  Current Mental Status:   See below  Loss Factors:   work related stress,medication non compliance  Historical Factors:   history of bipolar disorder  Risk Reduction Factors:   resilience   Total Time spent with patient: 45 minutes Principal Problem:  Bipolar Disorder , Depressed  Diagnosis:   Patient Active Problem List   Diagnosis Date Noted  . Bipolar 1 disorder, depressed, severe (HCC) [F31.4] 09/30/2017  . Acute right ankle pain [M25.571] 05/06/2017  . PCOS (polycystic ovarian syndrome) [E28.2] 02/23/2014  . Amenorrhea [N91.2] 11/28/2012  . Depression [F32.9] 11/28/2012    Continued Clinical Symptoms:    The "Alcohol Use Disorders Identification Test", Guidelines for Use in Primary Care, Second Edition.  World Science writerHealth Organization [facility](WHO). Score between 0-7:  no or low risk or alcohol related problems. Score between 8-15:  moderate risk of alcohol related problems. Score between 16-19:  high risk of alcohol related problems. Score 20 or above:  warrants further diagnostic evaluation for alcohol dependence and treatment.   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 History of Bipolar Disorder diagnosis, and sub-optimal compliance with medications recently   Psychiatric Specialty Exam: Physical Exam  ROS  Blood pressure 108/77, pulse 89, temperature 97.9 F (36.6 C), temperature source Oral, resp. rate 16, height 5\' 2"  (1.575 m), weight 49.9 kg (110 lb).Body mass index is 20.12 kg/m.   see admit note MSE    COGNITIVE FEATURES THAT CONTRIBUTE TO RISK:  Closed-mindedness and Loss of executive function    SUICIDE RISK:   Moderate:  Frequent suicidal ideation with limited intensity, and duration, some specificity in terms of plans, no associated intent, good  self-control, limited dysphoria/symptomatology, some risk factors present, and identifiable protective factors, including available and accessible social support.  PLAN OF CARE: Patient will be admitted to inpatient psychiatric unit for stabilization and safety. Will provide and encourage milieu participation. Provide medication management and maked adjustments as needed.  Will follow daily.    I certify that inpatient services furnished can reasonably be expected to improve the patient's condition.   Craige CottaFernando A Cobos, MD 10/01/2017, 11:14 AM

## 2017-10-01 NOTE — [facility] Suicide Risk Assessment (Signed)
[facility] INPATIENT:  Family/Significant Other Suicide Prevention Education  Suicide Prevention Education:  Contact Attempts: Daryl EasternCody Gundry (pt's husband) [phone] has been identified by the patient as the family member/significant other with whom the patient will be residing, and identified as the person(s) who will aid the patient in the event of a mental health crisis.  With written consent from the patient, two attempts were made to provide suicide prevention education, prior to and/or following the patient's discharge.  We were unsuccessful in providing suicide prevention education.  A suicide education pamphlet was given to the patient to share with family/significant other.  Date and time of first attempt: 10/01/17 at 10:44AM (voicemail left requesting call back at his earliest convenience).   Ledell PeoplesHeather N Smart LCSW 10/01/2017, 10:43 AM   Pt's husband called back. SPE and aftercare reviewed with pt. He discussed pt's medications and how pt has not been taking her medications as prescribed lately. "I'm really worried about her having access to so much medication at home." CSW and pt's husband discussed the possibility of him managing her medication for a few weeks or months until he and [name] feel that she has been doing well for an extended period of time. Patient's husband reports that pt can return home after she discharges from the hospital. No guns/weapons in the home.  Trula SladeHeather Smart, MSW, LCSW Clinical Social Worker 10/01/2017 12:10 PM
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
## 2017-10-01 NOTE — Progress Notes (Signed)
  DATA ACTION RESPONSE  Objective- Pt. is visible in the hallway, seen talking on the phone.Presents with an anxious/depressed/tearful    affect and mood. Pt was guarded with interaction. Pt states she does not know why she feels depressed.  Subjective- Denies having any HI/AVH/Pain at this time. Endorses passive SI but verbal contracts for safety.  Is cooperative and remains safe on the unit.  1:1 interaction in private to establish rapport. Encouragement, education, & support given from staff.  PRN Ativan requested and will re-eval accordingly.   Safety maintained with Q 15 checks. Continue with POC.

## 2017-10-01 NOTE — Progress Notes (Signed)
D: Patient alert and oriented. Affect/mood: Depressed mood, flat affect, slow soft speech. Patient is tearful during interaction, reports that she has been seeing dead people for the past few weeks. States: "they're not real, they're in my head". Denies seeing dead people since arriving on the unit. Endorses passive SI however does not elaborate, contracts for safety at this time. Denies pain. Patient was unable to identify a goal for treatment at this time. Requests to start Wellbutrin 150 mg tomorrow because if she doesn't take much earlier in the day it will keep her up at night. Wellbutrin not administered at this time.  A: Scheduled medications administered to patient per MD order (with exception of Wellbutrin). Support and encouragement provided. Routine safety checks conducted every 15 minutes. Patient informed to notify staff with problems or concerns. Patient agrees.  R: Continues to endorse passive SI,  contracts for safety at this time. Verbalizes importance of medication compliance and treatment plan. Patient receptive, calm, and cooperative. Has been observed lying in bed throughout the day. Patient is safe at this time.

## 2017-10-01 NOTE — Progress Notes (Signed)
Recreation Therapy Notes  Animal-Assisted Activity (AAA) Program Checklist/Progress Notes Patient Eligibility Criteria Checklist & Daily Group note for Rec TxIntervention  Date: 11.27.2018 Time: 2:45pm Location: Adult Unit  AAA/T Program Assumption of Risk Form signed by Patient/ or Parent Legal Guardian Yes  Patient is free of allergies or sever asthma Yes  Patient reports no fear of animals Yes  Patient reports no history of cruelty to animals Yes  Patient understands his/her participation is voluntary Yes  Patient washes hands before animal contact Yes  Patient washes hands after animal contact Yes  Behavioral Response: Appropriate   Education:Hand Washing, Appropriate Animal Interaction   Education Outcome: Acknowledges education.   Clinical Observations/Feedback: Patient attended session and interacted appropriately with therapy dog and peers.   Casey Glenn, LRT/CTRS        Casey KlinefelterBlanchfield, Casey Glenn L 10/01/2017 3:35 PM

## 2017-10-01 NOTE — H&P (Signed)
Psychiatric Admission Assessment Adult  Patient Identification: Casey Glenn MRN:  [id number] Date of Evaluation:  10/01/2017 Chief Complaint: " things have been going down hill" Principal Diagnosis:  Bipolar Disorder  Diagnosis:   Patient Active Problem List   Diagnosis Date Noted  . Bipolar 1 disorder, depressed, severe ([place]) [F31.4] 09/30/2017  . Acute right ankle pain [M25.571] 05/06/2017  . PCOS (polycystic ovarian syndrome) [E28.2] 02/23/2014  . Amenorrhea [N91.2] 11/28/2012  . Depression [F32.9] 11/28/2012   History of Present Illness: 31 year old married female, who presented to ED due to depression ,  overdosing on Ativan and Ambien. States 2 days prior to admission she took about 10-15 Ativans and about 6 Ambiens . At that time states she was not trying to kill self but " just trying to rest,make the pain go away". On day of admission she states she impulsively tried to overdose on Ativan, but husband saw it and prevented her from taking them, after which she came to ED .   States she has been feeling depressed for several weeks . She reports prior history of Bipolar Disorder diagnosis and reports she has had brief episodes of hypomania recently as well . Associated Signs/Symptoms: Depression Symptoms:  depressed mood, anhedonia, hypersomnia, suicidal thoughts with specific plan, loss of energy/fatigue, decreased appetite, decreased sense of self esteem (Hypo) Manic Symptoms:  Not currently  Anxiety Symptoms: reports increased vague sense of apprehension recently Psychotic Symptoms:  Denies  PTSD Symptoms: Denies  Total Time spent with patient: 45 minutes  Past Psychiatric History: no prior psychiatric admissions, one prior suicidal gesture by cutting wrists 2-3 years ago, no other history of self cutting, no history of psychosis, has been diagnosed with Bipolar Disorder in 2013.  Reports episodes of depression, reports periods suggestive of hypomania . Describes   agoraphobia, but only occasional panic attacks . Denies history of violence .   Is the patient at risk to self? Yes.    Has the patient been a risk to self in the past 6 months? Yes.    Has the patient been a risk to self within the distant past? Yes.    Is the patient a risk to others? No.  Has the patient been a risk to others in the past 6 months? No.  Has the patient been a risk to others within the distant past? No.   Prior Inpatient Therapy:  no  Prior Outpatient Therapy:  Dr. Albertine Patricia at [facility] , also has an established therapist Julien Girt )   Alcohol Screening: 1. How often do you have a drink containing alcohol?: Never 2. How many drinks containing alcohol do you have on a typical day when you are drinking?: 1 or 2(denies drinking alcohol) 3. How often do you have six or more drinks on one occasion?: Never AUDIT-C Score: 0 9. Have you or someone else been injured as a result of your drinking?: No 10. Has a relative or friend or a doctor or another health worker been concerned about your drinking or suggested you cut down?: No Intervention/Follow-up: AUDIT Score <7 follow-up not indicated Substance Abuse History in the last 12 months: denies alcohol or drug abuse  Consequences of Substance Abuse: Denies  Previous Psychotropic Medications: has been on Lamictal x several years, states she had tapered down dose to 50 mgrs QDAY recently. States " Lamictal does work for me". Ambien 10 mgrs QHS which she has been taking more regularly over the last two weeks, Ativan 1 mgr  Q 6 H  - states she was taking it once a week or so, but has taken it more often recently . She states she was also on Wellbutrin XL, which helped and was well tolerated. She had stopped it a few weeks ago.  Psychological Evaluations: No  Past Medical History: PCOS .   Past Medical History:  Diagnosis Date  . Amenorrhea following discontinuation of oral contraceptive use    none since 2013  . Bipolar 1 disorder  [facility])     Past Surgical History:  Procedure Laterality Date  . WISDOM TOOTH EXTRACTION     Family History: parents alive, has one brother  Family History  Problem Relation Age of Onset  . Hyperlipidemia Maternal Grandmother   . Hyperlipidemia Paternal Grandmother    Family Psychiatric  History: father has history of depression, states a paternal uncle had bipolar disorder and committed suicide, cousins have bipolar disorder  Tobacco Screening: Have you used any form of tobacco in the last 30 days? (Cigarettes, Smokeless Tobacco, Cigars, and/or Pipes): No Social History: 31 year old married female, no children, employed as Pharmacist, hospital .  Social History   Substance and Sexual Activity  Alcohol Use No  . Frequency: Never   Comment: Rarely     Social History   Substance and Sexual Activity  Drug Use No    Additional Social History: Marital status: Married Number of Years Married: 7 What types of issues is patient dealing with in the relationship?: "we are doing well." "He got frustrated with me recently for not taking my meds the right way."  Additional relationship information: n/a  Are you sexually active?: Yes What is your sexual orientation?: heterosexual Has your sexual activity been affected by drugs, alcohol, medication, or emotional stress?: n/a  Does patient have children?: No    Pain Medications: denies Prescriptions: denies Over the Counter: denies History of alcohol / drug use?: No history of alcohol / drug abuse Longest period of sobriety (when/how long): NA  Allergies:  No Known Allergies Lab Results:  Results for orders placed or performed during the hospital encounter of 09/30/17 (from the past 48 hour(s))  Comprehensive metabolic panel     Status: None   Collection Time: 09/30/17 12:29 PM  Result Value Ref Range   Sodium 138 135 - 145 mmol/L   Potassium 4.1 3.5 - 5.1 mmol/L   Chloride 104 101 - 111 mmol/L   CO2 28 22 - 32 mmol/L   Glucose, Bld 92 65 - 99  mg/dL   BUN 11 6 - 20 mg/dL   Creatinine, Ser 0.91 0.44 - 1.00 mg/dL   Calcium 9.8 8.9 - 10.3 mg/dL   Total Protein 7.0 6.5 - 8.1 g/dL   Albumin 4.2 3.5 - 5.0 g/dL   AST 26 15 - 41 U/L   ALT 14 14 - 54 U/L   Alkaline Phosphatase 53 38 - 126 U/L   Total Bilirubin 0.9 0.3 - 1.2 mg/dL   GFR calc non Af Amer >60 >60 mL/min   GFR calc Af Amer >60 >60 mL/min    Comment: (NOTE) The eGFR has been calculated using the CKD EPI equation. This calculation has not been validated in all clinical situations. eGFR's persistently <60 mL/min signify possible Chronic Kidney Disease.    Anion gap 6 5 - 15  Ethanol     Status: None   Collection Time: 09/30/17 12:29 PM  Result Value Ref Range   Alcohol, Ethyl (B) <10 <10 mg/dL  Comment:        LOWEST DETECTABLE LIMIT FOR SERUM ALCOHOL IS 10 mg/dL FOR MEDICAL PURPOSES ONLY   Salicylate level     Status: None   Collection Time: 09/30/17 12:29 PM  Result Value Ref Range   Salicylate Lvl <7.7 2.8 - 30.0 mg/dL  Acetaminophen level     Status: Abnormal   Collection Time: 09/30/17 12:29 PM  Result Value Ref Range   Acetaminophen (Tylenol), Serum <10 (L) 10 - 30 ug/mL    Comment:        THERAPEUTIC CONCENTRATIONS VARY SIGNIFICANTLY. A RANGE OF 10-30 ug/mL MAY BE AN EFFECTIVE CONCENTRATION FOR MANY PATIENTS. HOWEVER, SOME ARE BEST TREATED AT CONCENTRATIONS OUTSIDE THIS RANGE. ACETAMINOPHEN CONCENTRATIONS >150 ug/mL AT 4 HOURS AFTER INGESTION AND >50 ug/mL AT 12 HOURS AFTER INGESTION ARE OFTEN ASSOCIATED WITH TOXIC REACTIONS.   cbc     Status: None   Collection Time: 09/30/17 12:29 PM  Result Value Ref Range   WBC 6.2 4.0 - 10.5 K/uL   RBC 4.77 3.87 - 5.11 MIL/uL   Hemoglobin 14.8 12.0 - 15.0 g/dL   HCT 42.0 36.0 - 46.0 %   MCV 88.1 78.0 - 100.0 fL   MCH 31.0 26.0 - 34.0 pg   MCHC 35.2 30.0 - 36.0 g/dL   RDW 13.2 11.5 - 15.5 %   Platelets 171 150 - 400 K/uL  I-Stat beta hCG blood, ED     Status: None   Collection Time: 09/30/17  12:40 PM  Result Value Ref Range   I-stat hCG, quantitative <5.0 <5 mIU/mL   Comment 3            Comment:   GEST. AGE      CONC.  (mIU/mL)   <=1 WEEK        5 - 50     2 WEEKS       50 - 500     3 WEEKS       100 - 10,000     4 WEEKS     1,000 - 30,000        FEMALE AND NON-PREGNANT FEMALE:     LESS THAN 5 mIU/mL   Rapid urine drug screen (hospital performed)     Status: Abnormal   Collection Time: 09/30/17  1:39 PM  Result Value Ref Range   Opiates NONE DETECTED NONE DETECTED   Cocaine NONE DETECTED NONE DETECTED   Benzodiazepines POSITIVE (A) NONE DETECTED   Amphetamines NONE DETECTED NONE DETECTED   Tetrahydrocannabinol NONE DETECTED NONE DETECTED   Barbiturates NONE DETECTED NONE DETECTED    Comment:        DRUG SCREEN FOR MEDICAL PURPOSES ONLY.  IF CONFIRMATION IS NEEDED FOR ANY PURPOSE, NOTIFY LAB WITHIN 5 DAYS.        LOWEST DETECTABLE LIMITS FOR URINE DRUG SCREEN Drug Class       Cutoff (ng/mL) Amphetamine      1000 Barbiturate      200 Benzodiazepine   939 Tricyclics       030 Opiates          300 Cocaine          300 THC              50     Blood Alcohol level:  Lab Results  Component Value Date   ETH <10 07/28/3006    Metabolic Disorder Labs:  No results found for: HGBA1C, MPG Lab Results  Component Value Date  PROLACTIN 10.5 01/14/2013   Lab Results  Component Value Date   CHOL 132 03/11/2014   TRIG 36 03/11/2014   HDL 64 03/11/2014   CHOLHDL 2.1 03/11/2014   VLDL 7 03/11/2014   LDLCALC 61 03/11/2014    Current Medications: Current Facility-Administered Medications  Medication Dose Route Frequency Provider Last Rate Last Dose  . acetaminophen (TYLENOL) tablet 650 mg  650 mg Oral Q6H PRN Ethelene Hal, NP      . alum & mag hydroxide-simeth (MAALOX/MYLANTA) 200-200-20 MG/5ML suspension 30 mL  30 mL Oral Q4H PRN Ethelene Hal, NP      . hydrOXYzine (ATARAX/VISTARIL) tablet 25 mg  25 mg Oral TID PRN Ethelene Hal, NP       . magnesium hydroxide (MILK OF MAGNESIA) suspension 30 mL  30 mL Oral Daily PRN Ethelene Hal, NP      . traZODone (DESYREL) tablet 50 mg  50 mg Oral QHS PRN Ethelene Hal, NP       PTA Medications: Medications Prior to Admission  Medication Sig Dispense Refill Last Dose  . buPROPion (WELLBUTRIN XL) 150 MG 24 hr tablet    Taking  . CALCIUM-MAGNESIUM-ZINC PO Take 1 tablet by mouth daily.   09/30/2017 at Unknown time  . Diclofenac Sodium (PENNSAID) 2 % SOLN Place 1 application onto the skin 2 (two) times daily. (Patient not taking: Reported on 09/30/2017) 112 g 2 Not Taking at Unknown time  . ferrous sulfate 325 (65 FE) MG EC tablet Take 325 mg by mouth daily with breakfast.    09/30/2017 at Unknown time  . lamoTRIgine (LAMICTAL) 25 MG tablet Take 100 mg by mouth daily.    09/30/2017 at Unknown time  . LORazepam (ATIVAN) 1 MG tablet Take 1 mg by mouth every 6 (six) hours as needed for anxiety or sleep.    09/29/2017 at Unknown time  . MELATONIN PO Take 9 mg by mouth at bedtime as needed (for sleep).    09/29/2017 at Unknown time  . Multiple Vitamins-Minerals (MULTIVITAMIN ADULT PO) Take 1 tablet by mouth daily.    09/30/2017 at Unknown time  . zolpidem (AMBIEN) 10 MG tablet Take 10 mg by mouth at bedtime as needed for sleep.   09/29/2017 at Unknown time    Musculoskeletal: Strength & Muscle Tone: within normal limits Gait & Station: normal Patient leans: N/A  Psychiatric Specialty Exam: Physical Exam  Review of Systems  Constitutional: Negative.   HENT: Negative.   Eyes: Negative.   Respiratory: Negative.   Cardiovascular: Negative.   Gastrointestinal: Negative.   Genitourinary: Negative.   Musculoskeletal: Negative.   Skin: Negative.   Neurological: Negative for seizures.  Endo/Heme/Allergies: Negative.   Psychiatric/Behavioral: Positive for depression and suicidal ideas.  All other systems reviewed and are negative.   Blood pressure 108/77, pulse 89,  temperature 97.9 F (36.6 C), temperature source Oral, resp. rate 16, height _0  (1.575 m), weight 49.9 kg (110 lb).Body mass index is 20.12 kg/m.  General Appearance: Fairly Groomed  Eye Contact:  Good  Speech:  Normal Rate  Volume:  Decreased  Mood:  depressed, sad   Affect:  constricted but smiles at times appropriately   Thought Process:  Linear and Descriptions of Associations: Intact  Orientation:  Other:  fully alert and attentive   Thought Content:  no hallucinations, no delusions, not internally preoccupied   Suicidal Thoughts:  No denies suicidal or self injurious ideations, denies any homicidal or violent ideations   Homicidal  Thoughts:  No  Memory:  recent and remote grossly intact   Judgement:  Other:  fair  Insight:  Fair  Psychomotor Activity:  Normal  Concentration:  Concentration: Good and Attention Span: Good  Recall:  Good  Fund of Knowledge:  Good  Language:  Good  Akathisia:  Negative  Handed:  Right  AIMS (if indicated):     Assets:  Desire for Improvement Resilience  ADL's:  Intact  Cognition:  WNL  Sleep:  Number of Hours: 6    Treatment Plan Summary: Daily contact with patient to assess and evaluate symptoms and progress in treatment, Medication management, Plan inpatient treatment and medications as below  Observation Level/Precautions:  15 minute checks  Laboratory:  as needed   Psychotherapy:  Milieu, group therapy   Medications:  She reports a history of good response to Wellbutrin and Lamictal combination and feels that recent decompensation has been possibly related to having stopped Wellbutrin about two months ago and having decreased Lamictal dose from 150 mgrs daily to 25 mgrs daily . She denies medication side effects and history of good response to these medications . Restart Wellbutrin XL 150 mgr  QAM  Continue Lamictal 50 mgrs QDAY   Consultations:  As needed   Discharge Concerns: -   Estimated LOS: 5 days   Other:     Physician  Treatment Plan for Primary Diagnosis: Bipolar Disorder, Depressed  Long Term Goal(s): Improvement in symptoms so as ready for discharge  Short Term Goals: Ability to identify changes in lifestyle to reduce recurrence of condition will improve and Compliance with prescribed medications will improve  Physician Treatment Plan for Secondary Diagnosis: Active Problems:   Bipolar 1 disorder, depressed, severe ([place])  Long Term Goal(s): Improvement in symptoms so as ready for discharge  Short Term Goals: As above   I certify that inpatient services furnished can reasonably be expected to improve the patient's condition.    Jenne Campus, MD 11/27/201810:41 AM

## 2017-10-02 DIAGNOSIS — F419 Anxiety disorder, unspecified: Secondary | ICD-10-CM

## 2017-10-02 DIAGNOSIS — G4700 Insomnia, unspecified: Secondary | ICD-10-CM

## 2017-10-02 DIAGNOSIS — R45851 Suicidal ideations: Secondary | ICD-10-CM

## 2017-10-02 DIAGNOSIS — R450 Nervousness: Secondary | ICD-10-CM

## 2017-10-02 DIAGNOSIS — F39 Unspecified mood [affective] disorder: Secondary | ICD-10-CM

## 2017-10-02 MED ORDER — BUPROPION HCL ER (XL) 300 MG PO TB24
300.0000 mg | ORAL_TABLET | Freq: Every day | ORAL | Status: DC
  Administered 2017-10-03 – 2017-10-04 (×2): 300 mg via ORAL
  Filled 2017-10-02 (×3): qty 1

## 2017-10-02 NOTE — Progress Notes (Signed)
[facility]  10/02/2017 2:52 PM Casey Glenn  MRN:  [id number]   Subjective:  Patient reports that she is still very depressed and having a lot of suicidal thoughts but no plan or intent and she contracts for safety. She reports that she has had her Wellbutrin increased before and it helped pull her out of a "slump", but her best combination was Lamictal 125 mg and Wellbutrin XL 150 mg, but she was noncompliant.  Objective: Patient's chart and findings reviewed and discussed with treatment team. Patient appears depressed and is tearful with a flat affect. Will increase Wellbutrin XL to 300 mg Daily. And continue to monitor her progression.   Principal Problem: Bipolar 1 disorder, depressed, severe ([facility]) Diagnosis:   Patient Active Problem List   Diagnosis Date Noted  . Bipolar 1 disorder, depressed, severe ([facility]) [F31.4] 09/30/2017  . Acute right ankle pain [M25.571] 05/06/2017  . PCOS (polycystic ovarian syndrome) [E28.2] 02/23/2014  . Amenorrhea [N91.2] 11/28/2012  . Depression [F32.9] 11/28/2012   Total Time spent with patient: 25 minutes  Past Psychiatric History: See H&P  Past Medical History:  Past Medical History:  Diagnosis Date  . Amenorrhea following discontinuation of oral contraceptive use    none since 2013  . Bipolar 1 disorder [facility])     Past Surgical History:  Procedure Laterality Date  . WISDOM TOOTH EXTRACTION     Family History:  Family History  Problem Relation Age of Onset  . Hyperlipidemia Maternal Grandmother   . Hyperlipidemia Paternal Grandmother    Family Psychiatric  History: See H&P Social History:  Social History   Substance and Sexual Activity  Alcohol Use No  . Frequency: Never   Comment: Rarely     Social History   Substance and Sexual Activity  Drug Use No    Social History   Socioeconomic History  . Marital status: Married    Spouse name: None  . Number of children: None  . Years of education: None  . Highest  education level: None  Social Needs  . Financial resource strain: None  . Food insecurity - worry: None  . Food insecurity - inability: None  . Transportation needs - medical: None  . Transportation needs - non-medical: None  Occupational History  . None  Tobacco Use  . Smoking status: Never Smoker  . Smokeless tobacco: Never Used  Substance and Sexual Activity  . Alcohol use: No    Frequency: Never    Comment: Rarely  . Drug use: No  . Sexual activity: Yes    Birth control/protection: Condom  Other Topics Concern  . None  Social History Narrative  . None   Additional Social History:    Pain Medications: denies Prescriptions: denies Over the Counter: denies History of alcohol / drug use?: No history of alcohol / drug abuse Longest period of sobriety (when/how long): NA                    Sleep: Good  Appetite:  Good  Current Medications: Current Facility-Administered Medications  Medication Dose Route Frequency Provider Last Rate Last Dose  . acetaminophen (TYLENOL) tablet 650 mg  650 mg Oral Q6H PRN Laveda AbbeParks, Laurie Britton, NP      . alum & mag hydroxide-simeth (MAALOX/MYLANTA) 200-200-20 MG/5ML suspension 30 mL  30 mL Oral Q4H PRN Laveda AbbeParks, Laurie Britton, NP      . Melene Muller[START ON 10/03/2017] buPROPion (WELLBUTRIN XL) 24 hr tablet 300 mg  300 mg Oral  Daily Money, Gerlene Burdock, FNP      . hydrOXYzine (ATARAX/VISTARIL) tablet 25 mg  25 mg Oral TID PRN Laveda Abbe, NP   25 mg at 10/02/17 0829  . lamoTRIgine (LAMICTAL) tablet 50 mg  50 mg Oral Daily Edna Rede, Rockey Situ, MD   50 mg at 10/02/17 0805  . LORazepam (ATIVAN) tablet 0.5 mg  0.5 mg Oral Q6H PRN Mandie Crabbe, Rockey Situ, MD   0.5 mg at 10/01/17 2126  . magnesium hydroxide (MILK OF MAGNESIA) suspension 30 mL  30 mL Oral Daily PRN Laveda Abbe, NP      . zolpidem (AMBIEN) tablet 5 mg  5 mg Oral QHS PRN Jesilyn Easom, Rockey Situ, MD        Lab Results: No results found for this or any previous visit (from the past 48  hour(s)).  Blood Alcohol level:  Lab Results  Component Value Date   ETH <10 09/30/2017    Metabolic Disorder Labs: No results found for: HGBA1C, MPG Lab Results  Component Value Date   PROLACTIN 10.5 01/14/2013   Lab Results  Component Value Date   CHOL 132 03/11/2014   TRIG 36 03/11/2014   HDL 64 03/11/2014   CHOLHDL 2.1 03/11/2014   VLDL 7 03/11/2014   LDLCALC 61 03/11/2014    Physical Findings: AIMS: Facial and Oral Movements Muscles of Facial Expression: None, normal Lips and Perioral Area: None, normal Jaw: None, normal Tongue: None, normal,Extremity Movements Upper (arms, wrists, hands, fingers): None, normal Lower (legs, knees, ankles, toes): None, normal, Trunk Movements Neck, shoulders, hips: None, normal, Overall Severity Severity of abnormal movements (highest score from questions above): None, normal Incapacitation due to abnormal movements: None, normal Patient's awareness of abnormal movements (rate only patient's report): No Awareness, Dental Status Current problems with teeth and/or dentures?: No Does patient usually wear dentures?: No  CIWA:  CIWA-Ar Total: 0 COWS:  COWS Total Score: 0  Musculoskeletal: Strength & Muscle Tone: within normal limits Gait & Station: normal Patient leans: N/A  Psychiatric Specialty Exam: Physical Exam  Nursing note and vitals reviewed. Constitutional: She is oriented to person, place, and time. She appears well-developed and well-nourished.  Cardiovascular: Normal rate.  Respiratory: Effort normal.  Musculoskeletal: Normal range of motion.  Neurological: She is alert and oriented to person, place, and time.  Skin: Skin is warm.    Review of Systems  Constitutional: Negative.   HENT: Negative.   Eyes: Negative.   Respiratory: Negative.   Cardiovascular: Negative.   Gastrointestinal: Negative.   Genitourinary: Negative.   Musculoskeletal: Negative.   Skin: Negative.   Neurological: Negative.    Endo/Heme/Allergies: Negative.   Psychiatric/Behavioral: Positive for depression and suicidal ideas. Negative for hallucinations. The patient is nervous/anxious.     Blood pressure 123/82, pulse 70, temperature (!) 97.5 F (36.4 C), temperature source Oral, resp. rate 16, height 5\' 2"  (1.575 m), weight 49.9 kg (110 lb).Body mass index is 20.12 kg/m.  General Appearance: Casual  Eye Contact:  Good  Speech:  Clear and Coherent and Normal Rate  Volume:  Normal  Mood:  Depressed  Affect:  Depressed and Tearful  Thought Process:  Goal Directed and Descriptions of Associations: Intact  Orientation:  Full (Time, Place, and Person)  Thought Content:  WDL  Suicidal Thoughts:  Yes.  without intent/plan  Homicidal Thoughts:  No  Memory:  Immediate;   Good Recent;   Good Remote;   Good  Judgement:  Good  Insight:  Good  Psychomotor Activity:  Normal  Concentration:  Concentration: Good and Attention Span: Good  Recall:  Good  Fund of Knowledge:  Good  Language:  Good  Akathisia:  No  Handed:  Right  AIMS (if indicated):     Assets:  Communication Skills Desire for Improvement Financial Resources/Insurance Housing Physical Health Social Support Transportation  ADL's:  Intact  Cognition:  WNL  Sleep:  Number of Hours: 5.75   Problems Addressed: Bipolar I  Treatment Plan Summary: Daily contact with patient to assess and evaluate symptoms and progress in treatment, Medication management and Plan is to:  -Continue Lamictal 50 mg PO Daily for mood stability -Increase Wellbutrin XL 300 mg PO Daily for mood stability -Continue Vistaril 25 mg PO TID PRN for anxiety -Continue Ambien 5 mg PO QHS PRN for insomnia -Continue Ativan 0.5 mg PO Q6H PRN for anxiety -Encourage group therapy participation   Maryfrances Bunnellravis B Money, FNP 10/02/2017, 2:52 PM   Agree with NP Progress Note

## 2017-10-02 NOTE — Plan of Care (Signed)
Safety Care Plan Documentation  Goal: Periods of time without injury will increase Intervention: Patient is on q15 minute safety checks and low fall risk precautions. Patient verbally contracts for safety on the unit. Outcome: Patient remains safe at this time  10/02/2017 10:46 PM - Progressing by Ferrel Loganollazo, Yanira Tolsma A, RN

## 2017-10-02 NOTE — Progress Notes (Signed)
D: Patient is isolative and sitting in the alcove by the nurse's station by herself.  She is not interacting with staff or her peers.  She is compliant with her medications.  She has flat, blunted affect and appears tearful and sad.  Patient is experiencing a lot of anxiety and vistaril was offered.  Patient states, "I keep having these thought of suicide.  They don't stop."  Patient agreed to seek out staff if they became overwhelming for her.  She rates her depression and hopelessness as a 7; anxiety as a 5.  She appears timid and prefers to sit in the alcove and read her book.  She contracts for safety on the unit.  She reports her sleep and appetite are poor; her energy level is low and her concentration is poor.  Her goal today is to "attend a group." A: Continue to monitor medication management and MD orders.  Safety checks completed every 15 minutes per protocol.   R: Patient is isolative and withdrawn.

## 2017-10-02 NOTE — Progress Notes (Signed)
Pt woke up at this time and started crying. Pt was sitting in hallway stating "I'm having bad thoughts". Pt can verbal contract for safety. 1:1 therapeutic discussion given. PRN vistaril requested and given.Will re-eval. Pt was encourage to go lay down and rest or read a book. Pt verbalized understanding. Q 15 checks in effect.

## 2017-10-02 NOTE — Progress Notes (Signed)
Recreation Therapy Notes  Date: 10/02/17 Time: 0930 Location: 300 [address] Dayroom  Group Topic: Stress Management  Goal Area(s) Addresses:  Patient will verbalize importance of using healthy stress management.  Patient will identify positive emotions associated with healthy stress management.   Intervention: Stress Management  Activity :  Mountain Meditation.  LRT introduced the stress management technique of meditation.  Patients were to listen and follow along as LRT played meditation from the Calm app.  Education:  Stress Management, Discharge Planning.   Education Outcome: Acknowledges edcuation/In group clarification offered/Needs additional education  Clinical Observations/Feedback: Pt did not attend group.    Abdulrahman Bracey, LRT/CTRS         Braylen Staller A 10/02/2017 12:53 PM 

## 2017-10-02 NOTE — Progress Notes (Signed)
Nursing Progress Note 1900-0730  D) Patient presents with flat affect and appears sad/depressed. Patient reports, "I feel suicidal all the time". Patient states, "there are so many school shootings its hard to be positive". Patient requests Ativan for anxiety this evening and is provided Ambien by RN Marchelle FolksAmanda L. Patient did attend group. Patient reports SI but contracts for safety on the unit. Patient denies HI/AVH or pain.  A) Emotional support given. 1:1 interaction and active listening provided. Patient medicated as prescribed. Medications and plan of care reviewed with patient. Patient verbalized understanding without further questions. Snacks and fluids provided. Opportunities for questions or concerns presented to patient. Patient encouraged to continue to work on treatment goals. Labs, vital signs and patient behavior monitored throughout shift. Patient safety maintained with q15 min safety checks. Low fall risk precautions in place and reviewed with patient; patient verbalized understanding.  R) Patient receptive to interaction with nurse. Patient remains safe on the unit at this time. Patient denies any adverse medication reactions at this time. Patient is resting in bed without complaints. Will continue to monitor.

## 2017-10-03 NOTE — Progress Notes (Signed)
Patient ID: Casey Glenn, female   DOB: 03/03/1986, 31 y.o.   MRN: [id number]  Nursing Progress Note 1900-0730  D) Patient presents with pleasant mood and has animated affect during interactions. Patient remains cautious but reports, "I haven't had any thoughts of SI all day". Patient did attended group and was seen interactive in the milieu. Patient denies SI/HI/AVH or pain. Patient contracts for safety on the unit. Patient states, "I hope to discharge tomorrow, I'm ready to not be here anymore. My husband is nervous for me but my mom is going to come stay with us through the week". Patient reports sleeping well with current regimen and requests her PRN Ambien.  A) Emotional support given. 1:1 interaction and active listening provided. Patient medicated as prescribed. Medications and plan of care reviewed with patient. Patient verbalized understanding without further questions. Snacks and fluids provided. Opportunities for questions or concerns presented to patient. Patient encouraged to continue to work on treatment goals. Labs, vital signs and patient behavior monitored throughout shift. Patient safety maintained with q15 min safety checks. Low fall risk precautions in place and reviewed with patient; patient verbalized understanding.  R) Patient receptive to interaction with nurse. Patient remains safe on the unit at this time. Patient denies any adverse medication reactions at this time. Patient is resting in bed without complaints. Will continue to monitor.

## 2017-10-03 NOTE — Progress Notes (Addendum)
D:  Patient denied HI.  Denied A/V hallucinations.  Patient stated she does have SI thoughts, no plan, contracts for safety. A:  Medications administered per MD orders.  Emotional support and encouragement given patient. R:  Safety maintained with 15 minute checks.  Patient's self inventory sheet, patient sleeps good, no sleep medication given.  Fair appetite, normal energy level, good concentration.  Rated depression 6, hopeless 2, anxiety 5.  Denied withdrawals.  Denied SI.  Denied physical problems.  Drnied physical pain.  Goal is discharge.  Plans to talk to MD/SW.  Does have discharge plans.

## 2017-10-03 NOTE — Plan of Care (Signed)
Nurse discussed depression, anxiety, coping skills with patient.  

## 2017-10-03 NOTE — Plan of Care (Signed)
Safety Care Plan Documentation  Goal: Periods of time without injury will increase Intervention: Patient is on q15 minute safety checks and low fall risk precautions. Patient verbally contracts for safety on the unit. Outcome: Patient remains safe at this time  10/03/2017 9:34 PM - Progressing by Ferrel Loganollazo, Buffie Herne A, RN

## 2017-10-03 NOTE — Progress Notes (Signed)
[facility] MD Progress Note  10/03/2017 1:00 PM Casey Glenn  MRN:  [id number]   Subjective:  Patient states she is feeling partially better . She states she feels she is less depressed, less severely anxious. She states she has felt supported by her family, friends, and has had several visitors during her stay thus far . Denies suicidal ideations. Denies medication side effects.  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   Objective:  I have discussed case with treatment team and have met with patient . As per staff patient has presented with some improvement , but has reported ongoing depression, anxiety, passive SI. She presents with improving mood and range of affect . She is still anxious and affect, although more reactive, remains somewhat labile, but overall affect is improved . States she feels better, and is less ruminative about recent tragedies/school shootings which she had been ruminating about prior to her admission. She currently denies suicidal ideations, contracts for safety on unit Denies medication side effects.  Principal Problem: Bipolar 1 disorder, depressed, severe ([facility]) Diagnosis:   Patient Active Problem List   Diagnosis Date Noted  . Bipolar 1 disorder, depressed, severe ([facility]) [F31.4] 09/30/2017  . Acute right ankle pain [M25.571] 05/06/2017  . PCOS (polycystic ovarian syndrome) [E28.2] 02/23/2014  . Amenorrhea [N91.2] 11/28/2012  . Depression [F32.9] 11/28/2012   Total Time spent with patient: 20 minutes  Past Psychiatric History: See H&P  Past Medical History:  Past Medical History:  Diagnosis Date  . Amenorrhea following discontinuation of oral contraceptive use    none since 2013  . Bipolar 1 disorder [facility])     Past Surgical History:  Procedure Laterality Date  . WISDOM TOOTH  EXTRACTION     Family History:  Family History  Problem Relation Age of Onset  . Hyperlipidemia Maternal Grandmother   . Hyperlipidemia Paternal Grandmother    Family Psychiatric  History: See H&P Social History:  Social History   Substance and Sexual Activity  Alcohol Use No  . Frequency: Never   Comment: Rarely     Social History   Substance and Sexual Activity  Drug Use No    Social History   Socioeconomic History  . Marital status: Married    Spouse name: None  . Number of children: None  . Years of education: None  . Highest education level: None  Social Needs  . Financial resource strain: None  . Food insecurity - worry: None  . Food insecurity - inability: None  . Transportation needs - medical: None  . Transportation needs - non-medical: None  Occupational History  . None  Tobacco Use  . Smoking status: Never Smoker  . Smokeless tobacco: Never Used  Substance and Sexual Activity  . Alcohol use: No    Frequency: Never    Comment: Rarely  . Drug use: No  . Sexual activity: Yes    Birth control/protection: Condom  Other Topics Concern  . None  Social History Narrative  . None   Additional Social History:    Pain Medications: denies Prescriptions: denies Over the Counter: denies History of alcohol / drug use?: No history of alcohol / drug abuse Longest period of sobriety (when/how long): NA  Sleep: improving   Appetite:  Good  Current Medications: Current Facility-Administered Medications  Medication  Dose Route Frequency Provider Last Rate Last Dose  . acetaminophen (TYLENOL) tablet 650 mg  650 mg Oral Q6H PRN Ethelene Hal, NP      . alum & mag hydroxide-simeth (MAALOX/MYLANTA) 200-200-20 MG/5ML suspension 30 mL  30 mL Oral Q4H PRN Ethelene Hal, NP      . buPROPion (WELLBUTRIN XL) 24 hr tablet 300 mg  300 mg Oral Daily Money, Lowry Ram, FNP   300 mg at 10/03/17 0851  . hydrOXYzine (ATARAX/VISTARIL) tablet 25 mg  25 mg Oral  TID PRN Ethelene Hal, NP   25 mg at 10/02/17 0829  . lamoTRIgine (LAMICTAL) tablet 50 mg  50 mg Oral Daily Jackston [place], Myer Peer, MD   50 mg at 10/03/17 0852  . LORazepam (ATIVAN) tablet 0.5 mg  0.5 mg Oral Q6H PRN Quincy Prisco, Myer Peer, MD   0.5 mg at 10/03/17 0300  . magnesium hydroxide (MILK OF MAGNESIA) suspension 30 mL  30 mL Oral Daily PRN Ethelene Hal, NP      . zolpidem [facility]) tablet 5 mg  5 mg Oral QHS PRN Ziyon Cedotal, Myer Peer, MD   5 mg at 10/02/17 2153    Lab Results: No results found for this or any previous visit (from the past 48 hour(s)).  Blood Alcohol level:  Lab Results  Component Value Date   ETH <10 63/89/3734    Metabolic Disorder Labs: No results found for: HGBA1C, MPG Lab Results  Component Value Date   PROLACTIN 10.5 01/14/2013   Lab Results  Component Value Date   CHOL 132 03/11/2014   TRIG 36 03/11/2014   HDL 64 03/11/2014   CHOLHDL 2.1 03/11/2014   VLDL 7 03/11/2014   LDLCALC 61 03/11/2014    Physical Findings: AIMS: Facial and Oral Movements Muscles of Facial Expression: None, normal Lips and Perioral Area: None, normal Jaw: None, normal Tongue: None, normal,Extremity Movements Upper (arms, wrists, hands, fingers): None, normal Lower (legs, knees, ankles, toes): None, normal, Trunk Movements Neck, shoulders, hips: None, normal, Overall Severity Severity of abnormal movements (highest score from questions above): None, normal Incapacitation due to abnormal movements: None, normal Patient's awareness of abnormal movements (rate only patient's report): No Awareness, Dental Status Current problems with teeth and/or dentures?: No Does patient usually wear dentures?: No  CIWA:  CIWA-Ar Total: 0 COWS:  COWS Total Score: 0  Musculoskeletal: Strength & Muscle Tone: within normal limits Gait & Station: normal Patient leans: N/A  Psychiatric Specialty Exam: Physical Exam  Nursing note and vitals reviewed. Constitutional: She is  oriented to person, place, and time. She appears well-developed and well-nourished.  Cardiovascular: Normal rate.  Respiratory: Effort normal.  Musculoskeletal: Normal range of motion.  Neurological: She is alert and oriented to person, place, and time.  Skin: Skin is warm.    Review of Systems  Constitutional: Negative.   HENT: Negative.   Eyes: Negative.   Respiratory: Negative.   Cardiovascular: Negative.   Gastrointestinal: Negative.   Genitourinary: Negative.   Musculoskeletal: Negative.   Skin: Negative.   Neurological: Negative.   Endo/Heme/Allergies: Negative.   Psychiatric/Behavioral: Positive for depression and suicidal ideas. Negative for hallucinations. The patient is nervous/anxious.   denies chest pain, no shortness of breath, no vomiting , no fever   Blood pressure 108/67, pulse (!) 118, temperature (!) 97.5 F (36.4 C), temperature source Oral, resp. rate 16, height 5' 2"  (1.575 m), weight 49.9 kg (110 lb).Body mass index is 20.12 kg/m.  General Appearance: Well Groomed  Eye  Contact:  Good  Speech:  Normal Rate  Volume:  Normal  Mood:  Depressed and but reports she is improving   Affect:  vaguely constricted, but more reactive   Thought Process:  Goal Directed and Descriptions of Associations: Intact  Orientation:  Full (Time, Place, and Person)  Thought Content:  no hallucinations, no delusions, not internally preoccupied   Suicidal Thoughts:  No currently denies suicidal or self injurious ideations, denies homicidal or violent ideations  Homicidal Thoughts:  No  Memory:  Immediate;   Good Recent;   Good Remote;   Good  Judgement:  Other:  improving  Insight:  Fair- improving  Psychomotor Activity:  Normal  Concentration:  Concentration: Good and Attention Span: Good  Recall:  Good  Fund of Knowledge:  Good  Language:  Good  Akathisia:  No  Handed:  Right  AIMS (if indicated):     Assets:  Communication Skills Desire for Improvement Financial  Resources/Insurance Housing Physical Health Social Support Transportation  ADL's:  Intact  Cognition:  WNL  Sleep:  Number of Hours: 5.75   Assessment - patient presents with partially improved mood and range of affect, less anxiety, and decreased negative/anxious ruminations today. At this time denies SI. She is tolerating medications well at this time Treatment Plan Summary: Daily contact with patient to assess and evaluate symptoms and progress in treatment, Medication management and Plan is to:  -Encourage group and milieu participation to work on coping skills and symptom reduction -Treatment team working on disposition planning  -Continue Lamictal 50 mg PO Daily for mood disorder  -Continue  Wellbutrin XL 300 mg PO Daily for depression -Continue Vistaril 25 mg PO TID PRN for anxiety -Continue Ambien 5 mg PO QHS PRN for insomnia -Continue Ativan 0.5 mg PO Q6H PRN for anxiety    Jenne Campus, MD 10/03/2017, 1:00 PM   Patient ID: Casey Glenn, female   DOB: [birth year], 31 y.o.   MRN: [id number]

## 2017-10-04 MED ORDER — BUPROPION HCL ER (XL) 300 MG PO TB24: 300.0000 mg | ORAL_TABLET | Freq: Every day | ORAL | 0 refills | Status: AC

## 2017-10-04 MED ORDER — LAMOTRIGINE 25 MG PO TABS: 50.0000 mg | ORAL_TABLET | Freq: Every day | ORAL | 0 refills | Status: DC

## 2017-10-04 MED ORDER — ZOLPIDEM TARTRATE 5 MG PO TABS: 5.0000 mg | ORAL_TABLET | Freq: Every evening | ORAL | 0 refills | Status: DC | PRN

## 2017-10-04 MED ORDER — LORAZEPAM 0.5 MG PO TABS: 0.5000 mg | ORAL_TABLET | Freq: Four times a day (QID) | ORAL | 0 refills | Status: DC | PRN

## 2017-10-04 MED ORDER — HYDROXYZINE HCL 25 MG PO TABS: 25.0000 mg | ORAL_TABLET | Freq: Three times a day (TID) | ORAL | 0 refills | Status: DC | PRN

## 2017-10-04 NOTE — [facility] Group Notes (Signed)
Pt attended and participated in group. Pt rated their day an 8/10 and their goal was to be discharged tomorrow.

## 2017-10-04 NOTE — [facility] Suicide Risk Assessment (Signed)
[facility]   Principal Problem: Bipolar 1 disorder, depressed, severe ([facility]) Discharge Diagnoses:  Patient Active Problem List   Diagnosis Date Noted  . Bipolar 1 disorder, depressed, severe ([facility]) [F31.4] 09/30/2017  . Acute right ankle pain [M25.571] 05/06/2017  . PCOS (polycystic ovarian syndrome) [E28.2] 02/23/2014  . Amenorrhea [N91.2] 11/28/2012  . Depression [F32.9] 11/28/2012    Total Time spent with patient: 30 minutes  Musculoskeletal: Strength & Muscle Tone: within normal limits Gait & Station: normal Patient leans: N/A  Psychiatric Specialty Exam: ROS no headache, no chest pain, no dyspnea, no nausea, no vomiting   Blood pressure 118/62, pulse 77, temperature 98.3 F (36.8 C), temperature source Oral, resp. rate 16, height 5\' 2"  (1.575 m), weight 49.9 kg (110 lb).Body mass index is 20.12 kg/m.  General Appearance: Well Groomed  Eye Contact::  Good  Speech:  Normal Rate409  Volume:  Normal  Mood:  improved, reports feeling better  Affect:  Appropriate and more reactive , less anxious   Thought Process:  Linear and Descriptions of Associations: Intact  Orientation:  Full (Time, Place, and Person)  Thought Content:  no hallucinations, no delusions   Suicidal Thoughts:  No denies suicidal or self injurious ideations   Homicidal Thoughts:  No denies any homicidal ideations   Memory:  recent and remote grossly intact   Judgement:  Other:  improving   Insight:  improving   Psychomotor Activity:  Normal  Concentration:  Good  Recall:  Good  Fund of Knowledge:Good  Language: Good  Akathisia:  Yes  Handed:  Right  AIMS (if indicated):     Assets:  Communication Skills Desire for Improvement Resilience  Sleep:  Number of Hours: 5.75  Cognition: WNL  ADL's:  Intact   Mental Status Per Nursing Assessment::   On Admission:     Demographic Factors:  31 year old married female, employed   Loss Factors: Work related stressors    Historical Factors: History of bipolar disorder diagnosis, history of one episode of cutting .   Risk Reduction Factors:   Sense of responsibility to family, Employed, Living with another person, especially a relative and Positive coping skills or problem solving skills  Continued Clinical Symptoms:  At this time patient is alert, attentive, well related, pleasant, mood is improved, affect is more reactive, less anxious, brighter, no suicidal or self injurious ideations, no homicidal ideations, no psychotic symptoms. Behavior calm, in good control, pleasant on approach. Denies medication side effects- we reviewed medication side effects  At patient's request I spoke with her husband on the phone- he has been visiting her on unit regularly and corroborates that she is much improved, is in agreement with discharge today.  Cognitive Features That Contribute To Risk:  No gross cognitive deficits noted upon discharge. Is alert , attentive, and oriented x 3   Suicide Risk:  Mild:  Suicidal ideation of limited frequency, intensity, duration, and specificity.  There are no identifiable plans, no associated intent, mild dysphoria and related symptoms, good self-control (both objective and subjective assessment), few other risk factors, and identifiable protective factors, including available and accessible social support.  Follow-up Information    [facility] Follow up on 10/17/2017.   Specialty:  Behavioral Health Why:  Medication management appt on this date at 2:30PM with Dr. Phillip [name]. Thank you. Contact information: [address] [place] [address] [phone]        Dr. Tomasa RandKeilan Rickard MD  Follow up on 10/09/2017.   Why:  Therapy with Donny PiqueKeilan on Wednesday, December 5th at 7:00PM. Thank you.  Contact information: [address]  [place], [place] [address]  Phone: [phone] Fax: [facility]       [facility] Follow up on 10/08/2017.   Specialty:  Behavioral Health Why:  Assessment with Jeri Modenaita Clark at 8:30AM. Please bring insurance card. You will begin Mental Health Intensive Outpatient on this date as well (9am-12pm Monday through Friday for 2 weeks).  Contact information: [address] [place] [id number] [phone]          Plan Of Care/Follow-up recommendations:  Activity:  as tolerated  Diet:  regular Tests:  NA Other:  See below  Patient is expressing improvement and readiness for discharge. She is leaving unit in good spirits. Plans to return home. Follow up as above .   Craige CottaFernando A Cobos, MD 10/04/2017, 10:35 AM
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
## 2017-10-04 NOTE — Progress Notes (Signed)
Recreation Therapy Notes  Date: 10/04/17 Time: 0930 Location: 300 [address] Dayroom  Group Topic: Stress Management  Goal Area(s) Addresses:  Patient will verbalize importance of using healthy stress management.  Patient will identify positive emotions associated with healthy stress management.   Intervention: Stress Management  Activity :  Progressive Muscle Relaxation.  LRT introduced the stress management technique of progressive muscle relaxation.  LRT read a script to allow patients to tense and relax each muscle group one at a time.  Education:  Stress Management, Discharge Planning.   Education Outcome: Acknowledges edcuation/In group clarification offered/Needs additional education  Clinical Observations/Feedback: Pt did not attend group.    Caroll RancherMarjette Kirby Argueta, LRT/CTRS         Caroll RancherLindsay, Zarah Carbon A 10/04/2017 12:33 PM

## 2017-10-04 NOTE — Tx Team (Signed)
Interdisciplinary Treatment and Diagnostic Plan Update  10/04/2017 Time of Session: [id number] Casey Glenn MRN: [id number]  Principal Diagnosis: Bipolar 1 disorder, depressed, severe (HCC)  Secondary Diagnoses: Principal Problem:   Bipolar 1 disorder, depressed, severe (HCC)   Current Medications:  Current Facility-Administered Medications  Medication Dose Route Frequency Provider Last Rate Last Dose  . acetaminophen (TYLENOL) tablet 650 mg  650 mg Oral Q6H PRN Laveda AbbeParks, Laurie Britton, NP      . alum & mag hydroxide-simeth (MAALOX/MYLANTA) 200-200-20 MG/5ML suspension 30 mL  30 mL Oral Q4H PRN Laveda AbbeParks, Laurie Britton, NP      . buPROPion (WELLBUTRIN XL) 24 hr tablet 300 mg  300 mg Oral Daily Money, Gerlene Burdockravis B, FNP   300 mg at 10/04/17 [id number]  . hydrOXYzine (ATARAX/VISTARIL) tablet 25 mg  25 mg Oral TID PRN Laveda AbbeParks, Laurie Britton, NP   25 mg at 10/02/17 0829  . lamoTRIgine (LAMICTAL) tablet 50 mg  50 mg Oral Daily Cobos, Rockey SituFernando A, MD   50 mg at 10/04/17 [id number]  . LORazepam (ATIVAN) tablet 0.5 mg  0.5 mg Oral Q6H PRN Cobos, Rockey SituFernando A, MD   0.5 mg at 10/04/17 0308  . magnesium hydroxide (MILK OF MAGNESIA) suspension 30 mL  30 mL Oral Daily PRN Laveda AbbeParks, Laurie Britton, NP      . zolpidem [facility](AMBIEN) tablet 5 mg  5 mg Oral QHS PRN Cobos, Rockey SituFernando A, MD   5 mg at 10/03/17 2122   PTA Medications: Medications Prior to Admission  Medication Sig Dispense Refill Last Dose  . buPROPion (WELLBUTRIN XL) 150 MG 24 hr tablet    Taking  . CALCIUM-MAGNESIUM-ZINC PO Take 1 tablet by mouth daily.   09/30/2017 at Unknown time  . Diclofenac Sodium (PENNSAID) 2 % SOLN Place 1 application onto the skin 2 (two) times daily. (Patient not taking: Reported on 09/30/2017) 112 g 2 Not Taking at Unknown time  . ferrous sulfate 325 (65 FE) MG EC tablet Take 325 mg by mouth daily with breakfast.    09/30/2017 at Unknown time  . lamoTRIgine (LAMICTAL) 25 MG tablet Take 100 mg by mouth daily.    09/30/2017 at Unknown time  .  LORazepam (ATIVAN) 1 MG tablet Take 1 mg by mouth every 6 (six) hours as needed for anxiety or sleep.    09/29/2017 at Unknown time  . MELATONIN PO Take 9 mg by mouth at bedtime as needed (for sleep).    09/29/2017 at Unknown time  . Multiple Vitamins-Minerals (MULTIVITAMIN ADULT PO) Take 1 tablet by mouth daily.    09/30/2017 at Unknown time  . zolpidem (AMBIEN) 10 MG tablet Take 10 mg by mouth at bedtime as needed for sleep.   09/29/2017 at Unknown time    Patient Stressors: Occupational concerns  Patient Strengths: Average or above average intelligence Capable of independent living Communication skills General fund of knowledge Physical Health Supportive family/friends Work skills  Treatment Modalities: Medication Management, Group therapy, Case management,  1 to 1 session with clinician, Psychoeducation, Recreational therapy.   Physician Treatment Plan for Primary Diagnosis: Bipolar 1 disorder, depressed, severe (HCC) Long Term Goal(s): Improvement in symptoms so as ready for discharge Improvement in symptoms so as ready for discharge   Short Term Goals: Ability to identify changes in lifestyle to reduce recurrence of condition will improve Compliance with prescribed medications will improve  Medication Management: Evaluate patient's response, side effects, and tolerance of medication regimen.  Therapeutic Interventions: 1 to 1 sessions, Unit Group sessions and Medication administration.  Evaluation of Outcomes: Adequate for Discharge  Physician Treatment Plan for Secondary Diagnosis: Principal Problem:   Bipolar 1 disorder, depressed, severe (HCC)  Long Term Goal(s): Improvement in symptoms so as ready for discharge Improvement in symptoms so as ready for discharge   Short Term Goals: Ability to identify changes in lifestyle to reduce recurrence of condition will improve Compliance with prescribed medications will improve     Medication Management: Evaluate patient's  response, side effects, and tolerance of medication regimen.  Therapeutic Interventions: 1 to 1 sessions, Unit Group sessions and Medication administration.  Evaluation of Outcomes: Adequate for Discharge   RN Treatment Plan for Primary Diagnosis: Bipolar 1 disorder, depressed, severe (HCC) Long Term Goal(s): Knowledge of disease and therapeutic regimen to maintain health will improve  Short Term Goals: Ability to remain free from injury will improve, Ability to demonstrate self-control and Ability to identify and develop effective coping behaviors will improve  Medication Management: RN will administer medications as ordered by provider, will assess and evaluate patient's response and provide education to patient for prescribed medication. RN will report any adverse and/or side effects to prescribing provider.  Therapeutic Interventions: 1 on 1 counseling sessions, Psychoeducation, Medication administration, Evaluate responses to treatment, Monitor vital signs and CBGs as ordered, Perform/monitor CIWA, COWS, AIMS and Fall Risk screenings as ordered, Perform wound care treatments as ordered.  Evaluation of Outcomes: Adequate for Discharge   LCSW Treatment Plan for Primary Diagnosis: Bipolar 1 disorder, depressed, severe (HCC) Long Term Goal(s): Safe transition to appropriate next level of care at discharge, Engage patient in therapeutic group addressing interpersonal concerns.  Short Term Goals: Engage patient in aftercare planning with referrals and resources, Increase emotional regulation and Identify triggers associated with mental health/substance abuse issues  Therapeutic Interventions: Assess for all discharge needs, 1 to 1 time with Social worker, Explore available resources and support systems, Assess for adequacy in community support network, Educate family and significant other(s) on suicide prevention, Complete Psychosocial Assessment, Interpersonal group therapy.  Evaluation of  Outcomes: Adequate for Discharge   Progress in Treatment: Attending groups: Yes. Participating in groups: Yes. Taking medication as prescribed: Yes. Toleration medication: Yes. Family/Significant other contact made: Yes, individual(s) contacted:  pt's husband and therapist Patient understands diagnosis: Yes. Discussing patient identified problems/goals with staff: Yes. Medical problems stabilized or resolved: Yes. Denies suicidal/homicidal ideation: Yes. Issues/concerns per patient self-inventory: No. Other: n/a   New problem(s) identified: No, Describe:  n/a  New Short Term/Long Term Goal(s): elimination of SI thoughts; development of comprehensive mental wellness plan, medication management for mood stabilization.   Discharge Plan or Barriers: Pt has been scheduled to begin Mental Health IOP with Jeri Modenaita Clark on Tuesday, 10/08/17. She also has been scheduled with her current therapist and psychiatrist to resume services. Pt given MHAG pamphlet for additional community support. (Brief family meeting with MD and CSW scheduled at 1:30PM on 11/30)  Reason for Continuation of Hospitalization: none  Estimated Length of Stay: Friday, 10/04/17  Attendees: Patient: 10/04/2017 10:02 AM  Physician: Dr. Jama Flavorsobos MD; Dr. Altamese Carolinaainville MD 10/04/2017 10:02 AM  Nursing: Rayfield Citizenaroline RN; Jan RN 10/04/2017 10:02 AM  RN Care Manager: Onnie BoerJennifer Clark CM 10/04/2017 10:02 AM  Social Worker: Chartered loss adjusterHeather Smart, LCSW 10/04/2017 10:02 AM  Recreational Therapist: x 10/04/2017 10:02 AM  Other: Armandina StammerAgnes Nwoko NP; Feliz Beamravis Money NP 10/04/2017 10:02 AM  Other:  10/04/2017 10:02 AM  Other: 10/04/2017 10:02 AM    Scribe for Treatment Team: Ledell PeoplesHeather N Smart, LCSW 10/04/2017 10:02 AM

## 2017-10-04 NOTE — Plan of Care (Signed)
  Education: Emotional status will improve 10/04/2017 0848 - Adequate for Discharge by Joice Lofts, RN   Education: Verbalization of understanding the information provided will improve 10/04/2017 0848 - Adequate for Discharge by Joice Lofts, RN   Safety: Periods of time without injury will increase 10/04/2017 0848 - Adequate for Discharge by Joice Lofts, RN   Coping: Ability to cope will improve 10/04/2017 0848 - Adequate for Discharge by Joice Lofts, RN   Education: Knowledge of [place] Education information/materials will improve 10/04/2017 0848 - Completed/Met by Joice Lofts, RN   Education: Mental status will improve 10/04/2017 0848 - Completed/Met by Joice Lofts, [place] Behavior/Discharge Planning: Identification of resources available to assist in meeting health care needs will improve 10/04/2017 0848 - Completed/Met by Joice Lofts, RN   Self-Concept: Ability to disclose and discuss suicidal ideas will improve 10/04/2017 0848 - Completed/Met by Joice Lofts, RN   Self-Concept: Ability to verbalize positive feelings about self will improve 10/04/2017 0848 - Completed/Met by Joice Lofts, RN   Coping: Ability to verbalize feelings will improve 10/04/2017 0848 - Completed/Met by Joice Lofts, RN   Role Relationship: Ability to demonstrate positive changes in social behaviors and relationships will improve 10/04/2017 0848 - Completed/Met by Joice Lofts, RN

## 2017-10-04 NOTE — Progress Notes (Signed)
  [facility] :  Will you be returning to the same living situation after discharge:  Yes,  home At discharge, do you have transportation home?: Yes,  husband/mother Do you have the ability to pay for your medications: Yes,  [facility]  Release of information consent forms completed and submitted to medical records by CSW.  Patient to Follow up at: Follow-up Information    [facility] Follow up on 10/17/2017.   Specialty:  Behavioral Health Why:  Medication management appt on this date at 2:30PM with Dr. Phillip [name]. Thank you. Contact information: [address] [place] [address] [phone]        Dr. Tomasa RandKeilan Rickard MD Follow up on 10/09/2017.   Why:  Therapy with Donny PiqueKeilan on Wednesday, December 5th at 7:00PM. Thank you.  Contact information: [address]  [place], [place] [address]  Phone: [phone] Fax: [facility]       [facility] Follow up on 10/08/2017.   Specialty:  Behavioral Health Why:  Assessment with Jeri Modenaita Clark at 8:30AM. Please bring insurance card. You will begin Mental Health Intensive Outpatient on this date as well (9am-12pm Monday through Friday for 2 weeks).  Contact information: [address] [place] [id number] [phone]          Next level of care provider has access to [facility] Link:no  Safety Planning and Suicide Prevention discussed: Yes,  SPE completed with pt and her husband. SPI pamphlet and mobile crisis information provided to pt.   Have you used any form of tobacco in the last 30 days? (Cigarettes, Smokeless Tobacco, Cigars, and/or Pipes): No  Has patient been referred to the Quitline?: Patient refused referral  Patient has been referred for addiction treatment: Yes  Pulte HomesHeather N Smart, LCSW 10/04/2017, 10:01 AM

## 2017-10-04 NOTE — Progress Notes (Signed)
Discharge note:  Patient discharged home per MD order.  Patient has brighter affect and denies any severe depressive symptoms.  She denies any thoughts of self harm.  She rates her depression as a 2; denies any hopelessness and anxiety as a 4.  Reviewed AVS/transition with patient and she indicated understanding.  Patient received prescriptions of her medications.  Patient left ambulatory with her mother and husband.

## 2017-10-08 ENCOUNTER — Encounter: Payer: Self-pay | Admitting: Family Medicine

## 2017-10-11 ENCOUNTER — Ambulatory Visit: Payer: BC Managed Care – PPO | Admitting: Family Medicine

## 2017-10-11 ENCOUNTER — Encounter: Payer: Self-pay | Admitting: Family Medicine

## 2017-10-11 ENCOUNTER — Telehealth: Payer: Self-pay

## 2017-10-11 VITALS — BP 94/66 | HR 48 | Temp 98.1°F | Ht 62.0 in | Wt 108.0 lb

## 2017-10-11 DIAGNOSIS — Z23 Encounter for immunization: Secondary | ICD-10-CM

## 2017-10-11 DIAGNOSIS — F314 Bipolar disorder, current episode depressed, severe, without psychotic features: Secondary | ICD-10-CM | POA: Diagnosis not present

## 2017-10-11 NOTE — Telephone Encounter (Signed)
Copied from CRM [phone]#[id number]. Topic: General - Other >> Oct 11, 2017  2:56 PM Clack, Princella PellegriniJessica D wrote: Reason for CRM: Pt states she was seen today and she needs to return to work note for Monday. She also states she needs the work note to day" Dr. Earlene PlaterWallace is medical releasing patient to return to work". She needs it fax before Monday, fax number [phone], attn: Jeannie DoneSarah Foglenan, [employer]. Please call pt once the note has been fax.  >> Oct 11, 2017  3:26 PM Gerrianne ScalePayne, Angela L wrote: Patient calling back about letter she also want to add that the letter need to have she can be medically released on Monday 10-14-17  Excused from any after school commitments for the present time and patient will asset with Phillip [name] psychiatrist  Please fax [facility] [phone]  >> Oct 11, 2017  3:34 PM Windy KalataMichael, Taylor L, NT wrote: Patient is calling back states it is urgent that this gets taken care of so that she can go back to work on 12/10. Please contact patient when it has been faxed over.

## 2017-10-14 ENCOUNTER — Encounter: Payer: Self-pay | Admitting: Family Medicine

## 2017-10-14 NOTE — Progress Notes (Signed)
Rogene Houstonmily Golda Riches is a 31 y.o. female is here for follow up.  History of Present Illness:   HPI: See Assessment and Plan section for Problem Based Charting of issues discussed today.  Health Maintenance Due  Topic Date Due  . HIV Screening  07/09/2000  . TETANUS/TDAP  07/09/2004   No flowsheet data found. PMHx, SurgHx, SocialHx, FamHx, Medications, and Allergies were reviewed in the Visit Navigator and updated as appropriate.   Patient Active Problem List   Diagnosis Date Noted  . Bipolar 1 disorder, depressed, severe (HCC) 09/30/2017  . Acute right ankle pain 05/06/2017  . PCOS (polycystic ovarian syndrome) 02/23/2014  . Amenorrhea 11/28/2012  . Depression 11/28/2012   Social History   Tobacco Use  . Smoking status: Never Smoker  . Smokeless tobacco: Never Used  Substance Use Topics  . Alcohol use: No    Frequency: Never    Comment: Rarely  . Drug use: No   Current Medications and Allergies:   Current Outpatient Medications:  .  buPROPion (WELLBUTRIN XL) 300 MG 24 hr tablet, Take 1 tablet (300 mg total) by mouth daily. For mood ocntrol, Disp: 30 tablet, Rfl: 0 .  hydrOXYzine (ATARAX/VISTARIL) 25 MG tablet, Take 1 tablet (25 mg total) by mouth 3 (three) times daily as needed for anxiety., Disp: 30 tablet, Rfl: 0 .  lamoTRIgine (LAMICTAL) 25 MG tablet, Take 2 tablets (50 mg total) by mouth daily. For mood control, Disp: 60 tablet, Rfl: 0 .  LORazepam (ATIVAN) 0.5 MG tablet, Take 1 tablet (0.5 mg total) by mouth every 6 (six) hours as needed for anxiety., Disp: 30 tablet, Rfl: 0 .  zolpidem (AMBIEN) 5 MG tablet, Take 1 tablet (5 mg total) by mouth at bedtime as needed for sleep., Disp: 30 tablet, Rfl: 0  No Known Allergies Review of Systems   Pertinent items are noted in the HPI. Otherwise, ROS is negative.  Vitals:   Vitals:   10/11/17 1048  BP: 94/66  Pulse: (!) 48  Temp: 98.1 F (36.7 C)  TempSrc: Oral  SpO2: 99%  Weight: 108 lb (49 kg)  Height: 5\' 2"   (1.575 m)     Body mass index is 19.75 kg/m. Physical Exam:   Physical Exam  Constitutional: She appears well-nourished.  HENT:  Head: Normocephalic and atraumatic.  Eyes: EOM are normal. Pupils are equal, round, and reactive to light.  Neck: Normal range of motion. Neck supple.  Cardiovascular: Normal rate, regular rhythm, normal heart sounds and intact distal pulses.  Pulmonary/Chest: Effort normal.  Abdominal: Soft.  Skin: Skin is warm.  Psychiatric: She has a normal mood and affect. Her behavior is normal.  Nursing note and vitals reviewed.   Assessment and Plan:   Diagnoses and all orders for this visit:  Bipolar 1 disorder, depressed, severe (HCC) Comments: The [facility] (Valium ingestion). Notes reviewed. She is here to check in. Feeling much better. Admits that she quit her medications a few months ago during hypomania and feeling that she did not need them. This led to a deep depression. She is now taking full dose Wellbutrin and increasing her Lithium, with a goal of 200 mg per her report after discussion with Psych. She is attending counseling. Visit with Psychiatrist next week. Her mother and husband have been very supportive. She feels safe at home. Medications are locked in her husband's car. She plans to return to work, without after-school commitments, on Monday. She feels  ready to get back to her schedule. She states that her family have been a little concerned about weight loss since her hospitalization. She is eating, but not very hungry. No fatigue, dizziness. She has not started running again.   We reviewed the importance of protein in her diet. She is Vegan. No labs today, but should consider getting a B12 in the future. I am not surprised at the weight loss. Wellbutrin was advanced quickly and she is not running. I don't think that we should push this issue just yet. Will monitor. Will provide education to  increase protein.  Need for prophylactic vaccination against diphtheria-tetanus-pertussis (DTP) -     Tdap vaccine greater than or equal to 7yo IM   . Reviewed expectations re: course of current medical issues. . Discussed self-management of symptoms. . Outlined signs and symptoms indicating need for more acute intervention. . Patient verbalized understanding and all questions were answered. Marland Kitchen. Health Maintenance issues including appropriate healthy diet, exercise, and smoking avoidance were discussed with patient. . See orders for this visit as documented in the electronic medical record. . Patient received an After Visit Summary.  Helane RimaErica Gabryel Talamo, DO [place] [place] [facility] 10/14/2017  No future appointments.

## 2017-12-13 ENCOUNTER — Encounter: Payer: Self-pay | Admitting: Surgical

## 2017-12-31 ENCOUNTER — Encounter: Payer: Self-pay | Admitting: Surgical

## 2018-01-28 ENCOUNTER — Telehealth: Payer: Self-pay

## 2018-01-28 NOTE — Telephone Encounter (Signed)
Pt called stating that she had been experiencing breast pain and pelvic pain. Dr.Dove said to put an order in for a Transvaginal U/S. PT does not have to come in for an appt per Dr.Dove. PT is going to check with her insurance to see about cost and call to let us know if she would like to order/schedule this.

## 2018-01-29 ENCOUNTER — Telehealth: Payer: Self-pay

## 2018-01-29 DIAGNOSIS — R102 Pelvic and perineal pain: Secondary | ICD-10-CM

## 2018-01-29 NOTE — Telephone Encounter (Signed)
Called pt lmg advising of scheduled appt 02/03/18 arrive 3:25 pm for appt 3:45. Drink 32 oz of water prior to ultrasound and do not void. [place] [facility] b[facility].

## 2018-01-29 NOTE — Telephone Encounter (Signed)
Spoke with pt yesterday about [facility] by [facility]. Pt needed to call insurance to find out price and give me a call back. PT called stating that her insurance would cover it. PT [facility]. Order placed.

## 2018-01-29 NOTE — Telephone Encounter (Signed)
Imaging dept called to let us know incorrect US order was placed. US order fixed and is now correct.

## 2018-01-30 ENCOUNTER — Ambulatory Visit: Payer: Self-pay | Admitting: Obstetrics & Gynecology

## 2018-02-03 ENCOUNTER — Ambulatory Visit
Admission: RE | Admit: 2018-02-03 | Discharge: 2018-02-03 | Disposition: A | Discharge status: Home / Self Care | Payer: BC Managed Care – PPO | Source: Ambulatory Visit | Attending: Obstetrics & Gynecology | Admitting: Obstetrics & Gynecology

## 2018-02-03 DIAGNOSIS — R102 Pelvic and perineal pain: Secondary | ICD-10-CM

## 2018-02-04 ENCOUNTER — Telehealth: Payer: Self-pay | Admitting: Obstetrics & Gynecology

## 2018-02-13 ENCOUNTER — Ambulatory Visit (INDEPENDENT_AMBULATORY_CARE_PROVIDER_SITE_OTHER): Payer: BC Managed Care – PPO | Admitting: Obstetrics & Gynecology

## 2018-02-13 ENCOUNTER — Encounter: Payer: Self-pay | Admitting: Obstetrics & Gynecology

## 2018-02-13 VITALS — BP 114/77 | HR 60 | Resp 16 | Ht 62.0 in | Wt 113.0 lb

## 2018-02-13 DIAGNOSIS — N912 Amenorrhea, unspecified: Secondary | ICD-10-CM | POA: Diagnosis not present

## 2018-02-13 DIAGNOSIS — N644 Mastodynia: Secondary | ICD-10-CM

## 2018-02-13 DIAGNOSIS — Z3202 Encounter for pregnancy test, result negative: Secondary | ICD-10-CM

## 2018-02-13 LAB — POCT URINE PREGNANCY: PREG TEST UR: NEGATIVE

## 2018-02-13 MED ORDER — MEDROXYPROGESTERONE ACETATE 10 MG PO TABS: 10.0000 mg | ORAL_TABLET | Freq: Every day | ORAL | 2 refills | Status: DC

## 2018-02-13 NOTE — Progress Notes (Signed)
Patient ID: Casey Glenn, female   DOB: 02/14/1986, 32 y.o.   MRN: [id number]  Chief Complaint  Patient presents with  . Breast and pelvic bloating    HPI Casey Glenn is a 32 y.o. female. She is married G0 here because of a 6 week h/o breast enlargement and tenderness. She also has had bloating for the same length of time. She has taken multiple UPTs which are all negative. She has PCOS and rarely ever has a period. I do ultrasounds on her periodically and her uterine lining is always 4 mm or less. She uses condoms, quit using OCPs in 2015 due to their effects on her mood. She tried to commit suicide recently but is stable at the present.   She has always told me that she does not want kids but may be thinking otherwise now.    HPI  Past Medical History:  Diagnosis Date  . Amenorrhea following discontinuation of oral contraceptive use    none since 2013  . Bipolar 1 disorder (HCC)   . Bipolar 1 disorder (HCC)   . Suicide attempt [facility](HCC)     Past Surgical History:  Procedure Laterality Date  . WISDOM TOOTH EXTRACTION      Family History  Problem Relation Age of Onset  . Mood Disorder Father   . Hyperlipidemia Maternal Grandmother   . Hyperlipidemia Paternal Grandmother   . Mood Disorder Paternal Grandfather     Social History Social History   Tobacco Use  . Smoking status: Never Smoker  . Smokeless tobacco: Never Used  Substance Use Topics  . Alcohol use: No    Frequency: Never    Comment: Rarely  . Drug use: No    No Known Allergies  Current Outpatient Medications  Medication Sig Dispense Refill  . buPROPion (WELLBUTRIN XL) 300 MG 24 hr tablet Take 1 tablet (300 mg total) by mouth daily. For mood ocntrol 30 tablet 0  . lamoTRIgine (LAMICTAL) 25 MG tablet Take 2 tablets (50 mg total) by mouth daily. For mood control (Patient taking differently: Take 125 mg by mouth daily. For mood control) 60 tablet 0  . LORazepam (ATIVAN) 0.5 MG tablet Take 1 tablet (0.5 mg  total) by mouth every 6 (six) hours as needed for anxiety. 30 tablet 0  . zolpidem (AMBIEN) 5 MG tablet Take 1 tablet (5 mg total) by mouth at bedtime as needed for sleep. 30 tablet 0   No current facility-administered medications for this visit.     Review of Systems Review of Systems Pap smear up to date and normal Blood pressure 114/77, pulse 60, resp. rate 16, height 5\' 2"  (1.575 m), weight 113 lb (51.3 kg).  Physical Exam Physical Exam Breathing, conversing, and ambulating normally Well nourished, well hydrated White female, no apparent distress Bimanual exam reveals a non tender normal size and shape retroverted uterus  Data Reviewed Thickness: 4 mm thick, normal. No endometrial fluid or focal abnormality  Right ovary  Measurements: 2.8 x 3.0 x 2.1 cm. Numerous follicles. No dominant mass.  Left ovary  Measurements: 2.7 x 3.6 x 2.0 cm. Numerous follicles. No dominant mass.  Other findings  No free pelvic fluid or adnexal masses.  IMPRESSION: Numerous follicles throughout both ovaries without focal mass.  Unremarkable uterus.  Assessment    Breast enlargement and pain- I cannot explain this but have suggested that she may be able to decrease the tenderness by changing to a bra that fits better. Her cup size changed  from a D to a C several years ago when she lost weight.  With regard to the bloating, I have rec'd a trial of a provera challenge. If this does not fix the bloating issue, then she will see her fam med doctor.   Come back in 3 weeks    Plan    See above       Allie Bossier 02/13/2018, 3:46 PM

## 2018-02-18 ENCOUNTER — Ambulatory Visit: Payer: Self-pay | Admitting: Obstetrics & Gynecology

## 2018-03-05 ENCOUNTER — Ambulatory Visit (INDEPENDENT_AMBULATORY_CARE_PROVIDER_SITE_OTHER): Payer: BC Managed Care – PPO | Admitting: Obstetrics & Gynecology

## 2018-03-05 ENCOUNTER — Encounter: Payer: Self-pay | Admitting: Obstetrics & Gynecology

## 2018-03-05 VITALS — BP 97/56 | HR 63 | Ht 62.0 in | Wt 109.0 lb

## 2018-03-05 DIAGNOSIS — N912 Amenorrhea, unspecified: Secondary | ICD-10-CM

## 2018-03-05 DIAGNOSIS — N644 Mastodynia: Secondary | ICD-10-CM | POA: Diagnosis not present

## 2018-03-05 NOTE — Progress Notes (Signed)
   Subjective:    Patient ID: Casey Glenn, female    DOB: Jun 09, 1986, 32 y.o.   MRN: [id number]  HPI  32 yo married P0 here for follow up regarding her bloating and breast enlargement. She took the provera and the bloating and breast tenderness improved.  She would like to talk about getting pregnant.   Review of Systems     Objective:   Physical Exam  Breathing, conversing, and ambulating normally Well nourished, well hydrated White female, no apparent distress      Assessment & Plan:  Desire for pregnancy- I am willing to prescribe femara for 3 months, but if she doesn't conceive, then she will need a referral to [place]. She is already taking a MVI. She understands that pregnancy involves LOTS of hormone changes and she should only try to conceive if she thinks that she is in a stable emotional place.

## 2018-06-04 ENCOUNTER — Ambulatory Visit (INDEPENDENT_AMBULATORY_CARE_PROVIDER_SITE_OTHER): Payer: BC Managed Care – PPO | Admitting: Family Medicine

## 2018-06-04 ENCOUNTER — Encounter: Payer: Self-pay | Admitting: Family Medicine

## 2018-06-04 VITALS — BP 102/64 | HR 59 | Temp 98.1°F | Ht 62.0 in | Wt 111.4 lb

## 2018-06-04 DIAGNOSIS — Z0000 Encounter for general adult medical examination without abnormal findings: Secondary | ICD-10-CM

## 2018-06-04 DIAGNOSIS — Z13220 Encounter for screening for lipoid disorders: Secondary | ICD-10-CM | POA: Diagnosis not present

## 2018-06-04 DIAGNOSIS — R5383 Other fatigue: Secondary | ICD-10-CM | POA: Diagnosis not present

## 2018-06-04 DIAGNOSIS — Z114 Encounter for screening for human immunodeficiency virus [HIV]: Secondary | ICD-10-CM | POA: Diagnosis not present

## 2018-06-04 DIAGNOSIS — N912 Amenorrhea, unspecified: Secondary | ICD-10-CM

## 2018-06-04 LAB — HCG, QUANTITATIVE, PREGNANCY: Quantitative HCG: 0.64 m[IU]/mL

## 2018-06-04 LAB — COMPREHENSIVE METABOLIC PANEL
ALT: 16 U/L (ref 0–35)
AST: 25 U/L (ref 0–37)
Albumin: 4.9 g/dL (ref 3.5–5.2)
Alkaline Phosphatase: 56 U/L (ref 39–117)
BUN: 10 mg/dL (ref 6–23)
CO2: 31 mEq/L (ref 19–32)
Calcium: 10.2 mg/dL (ref 8.4–10.5)
Chloride: 100 mEq/L (ref 96–112)
Creatinine, Ser: 0.92 mg/dL (ref 0.40–1.20)
GFR: 74.77 mL/min (ref >60.00)
Glucose, Bld: 87 mg/dL (ref 70–99)
Potassium: 3.7 mEq/L (ref 3.5–5.1)
Sodium: 139 mEq/L (ref 135–145)
Total Bilirubin: 0.6 mg/dL (ref 0.2–1.2)
Total Protein: 7.5 g/dL (ref 6.0–8.3)

## 2018-06-04 LAB — CBC WITH DIFFERENTIAL/PLATELET
Basophils Absolute: 0.1 10*3/uL (ref 0.0–0.1)
Basophils Relative: 1.4 % (ref 0.0–3.0)
Eosinophils Absolute: 0 10*3/uL (ref 0.0–0.7)
Eosinophils Relative: 0.7 % (ref 0.0–5.0)
HCT: 44 % (ref 36.0–46.0)
Hemoglobin: 15 g/dL (ref 12.0–15.0)
Lymphocytes Relative: 22 % (ref 12.0–46.0)
Lymphs Abs: 1.3 10*3/uL (ref 0.7–4.0)
MCHC: 34 g/dL (ref 30.0–36.0)
MCV: 90.4 fl (ref 78.0–100.0)
Monocytes Absolute: 0.3 10*3/uL (ref 0.1–1.0)
Monocytes Relative: 4.3 % (ref 3.0–12.0)
Neutro Abs: 4.4 10*3/uL (ref 1.4–7.7)
Neutrophils Relative %: 71.6 % (ref 43.0–77.0)
Platelets: 272 10*3/uL (ref 150.0–400.0)
RBC: 4.87 Mil/uL (ref 3.87–5.11)
RDW: 13.4 % (ref 11.5–15.5)
WBC: 6.1 10*3/uL (ref 4.0–10.5)

## 2018-06-04 LAB — LUTEINIZING HORMONE: LH: 3.29 m[IU]/mL

## 2018-06-04 LAB — TSH: TSH: 2.08 u[IU]/mL (ref 0.35–4.50)

## 2018-06-04 LAB — LIPID PANEL
Cholesterol: 198 mg/dL (ref 0–200)
HDL: 89.3 mg/dL (ref >39.00)
LDL Cholesterol: 99 mg/dL (ref 0–99)
NonHDL: 108.38
Total CHOL/HDL Ratio: 2
Triglycerides: 46 mg/dL (ref 0.0–149.0)
VLDL: 9.2 mg/dL (ref 0.0–40.0)

## 2018-06-04 LAB — FOLLICLE STIMULATING HORMONE: FSH: 8.4 m[IU]/mL

## 2018-06-04 NOTE — Progress Notes (Signed)
Subjective:    Casey Glenn is a 32 y.o. female and is here for a comprehensive physical exam.   Current Outpatient Medications:  .  buPROPion (WELLBUTRIN XL) 300 MG 24 hr tablet, Take 1 tablet (300 mg total) by mouth daily. For mood ocntrol, Disp: 30 tablet, Rfl: 0 .  lamoTRIgine (LAMICTAL) 25 MG tablet, Take 2 tablets (50 mg total) by mouth daily. For mood control (Patient taking differently: Take 125 mg by mouth daily. For mood control), Disp: 60 tablet, Rfl: 0 .  LORazepam (ATIVAN) 0.5 MG tablet, Take 1 tablet (0.5 mg total) by mouth every 6 (six) hours as needed for anxiety., Disp: 30 tablet, Rfl: 0 .  zolpidem (AMBIEN) 5 MG tablet, Take 1 tablet (5 mg total) by mouth at bedtime as needed for sleep., Disp: 30 tablet, Rfl: 0  Health Maintenance Due  Topic Date Due  . HIV Screening  07/09/2000    PMHx, SurgHx, SocialHx, Medications, and Allergies were reviewed in the Visit Navigator and updated as appropriate.   Past Medical History:  Diagnosis Date  . Amenorrhea following discontinuation of oral contraceptive use    none since 2013  . Bipolar 1 disorder (HCC)   . Bipolar 1 disorder (HCC)   . Suicide attempt [facility](HCC)      Past Surgical History:  Procedure Laterality Date  . WISDOM TOOTH EXTRACTION       Family History  Problem Relation Age of Onset  . Mood Disorder Father   . Hyperlipidemia Maternal Grandmother   . Hyperlipidemia Paternal Grandmother   . Mood Disorder Paternal Grandfather     Social History   Tobacco Use  . Smoking status: Never Smoker  . Smokeless tobacco: Never Used  Substance Use Topics  . Alcohol use: No    Frequency: Never    Comment: Rarely  . Drug use: No    Review of Systems:   Pertinent items are noted in the HPI. Otherwise, ROS is negative.  Objective:   BP 102/64   Pulse (!) 59   Temp 98.1 F (36.7 C) (Oral)   Ht 5\' 2"  (1.575 m)   Wt 111 lb 6.4 oz (50.5 kg)   SpO2 99%   BMI 20.38 kg/m    General appearance:  alert, cooperative and appears stated age. Head: normocephalic, without obvious abnormality, atraumatic. Neck: no adenopathy, supple, symmetrical, trachea midline; thyroid not enlarged, symmetric, no tenderness/mass/nodules. Lungs: clear to auscultation bilaterally. Heart: regular rate and rhythm Abdomen: soft, non-tender; no masses,  no organomegaly. Extremities: extremities normal, atraumatic, no cyanosis or edema. Skin: skin color, texture, turgor normal, no rashes or lesions. Lymph: cervical, supraclavicular, and axillary nodes normal; no abnormal inguinal nodes palpated. Neurologic: grossly normal.  Assessment/Plan:   Casey Burtonmily was seen today for annual exam.  Diagnoses and all orders for this visit:  Routine physical examination  Screening for HIV without presence of risk factors -     HIV antibody  Lipid screening -     Lipid panel  Fatigue, unspecified type -     CBC with Differential/Platelet -     Comprehensive metabolic panel -     TSH  Amenorrhea -     Follicle stimulating hormone -     Luteinizing hormone -     B-HCG Quant -     Prolactin -     Testos,Total,Free and SHBG (Female) -     Estrogens, Total    Patient Counseling:   [x]     Nutrition: Stressed importance  of moderation in sodium/caffeine intake, saturated fat and cholesterol, caloric balance, sufficient intake of fresh fruits, vegetables, fiber, calcium, iron, and 1 mg of folate supplement per day (for females capable of pregnancy).   [x]      Stressed the importance of regular exercise.    [x]     Substance Abuse: Discussed cessation/primary prevention of tobacco, alcohol, or other drug use; driving or other dangerous activities under the influence; availability of treatment for abuse.    [x]      Injury prevention: Discussed safety belts, safety helmets, smoke detector, smoking near bedding or upholstery.    [x]      Sexuality: Discussed sexually transmitted diseases, partner selection, use of  condoms, avoidance of unintended pregnancy  and contraceptive alternatives.    [x]     Dental health: Discussed importance of regular tooth brushing, flossing, and dental visits.   [x]      Health maintenance and immunizations reviewed. Please refer to Health maintenance section.   Casey Rima, DO [place] [facility]

## 2018-06-05 ENCOUNTER — Encounter: Payer: Self-pay | Admitting: Family Medicine

## 2018-06-08 LAB — TESTOS,TOTAL,FREE AND SHBG (FEMALE)
Free Testosterone: 1.4 pg/mL (ref 0.1–6.4)
Sex Hormone Binding: 91 nmol/L (ref 17–124)
Testosterone, Total, LC-MS-MS: 22 ng/dL (ref 2–45)

## 2018-06-08 LAB — ESTROGENS, TOTAL: Estrogen: 177.8 pg/mL

## 2018-06-08 LAB — HIV ANTIBODY (ROUTINE TESTING W REFLEX): HIV 1&2 Ab, 4th Generation: NONREACTIVE

## 2018-06-08 LAB — PROLACTIN: Prolactin: 11.9 ng/mL

## 2019-04-29 ENCOUNTER — Telehealth: Payer: Self-pay | Admitting: Family Medicine

## 2019-04-29 NOTE — Telephone Encounter (Signed)
Please advise 

## 2019-04-29 NOTE — Telephone Encounter (Signed)
Yes. I don't have a date but Dr Glennon Hamilton is going on maternity leave soon.

## 2019-04-29 NOTE — Telephone Encounter (Signed)
Patient called stated her therapist is moving out of town and is currently looking for a new therapist.  Patients current therapist gave her two suggestions. Dr. Rayna Sexton and Laroy Apple, [facility].D Patient wanted to get PCP advice and she what PCP recommends. Patient is requesting a call back Call back# [phone]

## 2019-04-29 NOTE — Telephone Encounter (Signed)
Do you know anything about this Dr. Vevelyn Pat out?

## 2019-04-29 NOTE — Telephone Encounter (Signed)
Both are excellent choices. I believe that Dr. Glennon Hamilton will be going out soon? Maternity leave? Clarify for her before she make that decision.

## 2019-05-01 NOTE — Telephone Encounter (Signed)
Can you call patient and make app.

## 2019-05-01 NOTE — Telephone Encounter (Signed)
Sure, let's make a doxy appt.

## 2019-05-01 NOTE — Telephone Encounter (Signed)
Pt requesting call back to discuss another alternative option for therapy. She wants to have two choices that she can compare. Dr. Glennon Hamilton is not accepting new pts at this time. Pt also has concerns and would like to discuss. Please advise if appt is necessary.

## 2019-05-01 NOTE — Telephone Encounter (Signed)
Do you want an app with her?

## 2019-05-06 ENCOUNTER — Ambulatory Visit (INDEPENDENT_AMBULATORY_CARE_PROVIDER_SITE_OTHER): Payer: BC Managed Care – PPO | Admitting: Family Medicine

## 2019-05-06 ENCOUNTER — Other Ambulatory Visit: Payer: Self-pay

## 2019-05-06 ENCOUNTER — Encounter: Payer: Self-pay | Admitting: Family Medicine

## 2019-05-06 VITALS — Ht 62.0 in

## 2019-05-06 DIAGNOSIS — F314 Bipolar disorder, current episode depressed, severe, without psychotic features: Secondary | ICD-10-CM

## 2019-05-06 NOTE — Progress Notes (Signed)
Virtual Visit via Video   Due to the COVID-19 pandemic, this visit was completed with telemedicine (audio/video) technology to reduce patient and provider exposure as well as to preserve personal protective equipment.   I connected with Rogene Houstonmily Golda Thien by a video enabled telemedicine application and verified that I am speaking with the correct person using two identifiers. Location patient: Home Location provider: [facility], Office Persons participating in the virtual visit: Kittie Platermily Golda Charlet, Indica Marcott, DO   I discussed the limitations of evaluation and management by telemedicine and the availability of in person appointments. The patient expressed understanding and agreed to proceed.  Care Team   Patient Care Team: Helane RimaWallace, Steven Veazie, DO as PCP - General (Family Medicine)  Subjective:   HPI: Patient with Hx of bipolar, severe, with intermittent thoughts of harm but no plans to act. Previous BH hospitalization last year. Doing as well as can be expected during COVID. 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 Exercising a lot. Her therapist x 8 years is moving away and she will be meeting a new one next week. Nervous about this, but had a good phone conversation and is optimistic.   Review of Systems  Constitutional: Negative for chills, fever, malaise/fatigue and weight loss.  Respiratory: Negative for cough, shortness of breath and wheezing.   Cardiovascular: Negative for chest pain, palpitations and leg swelling.  Gastrointestinal: Negative for abdominal pain, constipation, diarrhea, nausea and vomiting.  Genitourinary: Negative for dysuria and urgency.  Musculoskeletal: Negative for joint pain and myalgias.  Skin: Negative for rash.  Neurological: Negative for dizziness and headaches.  Psychiatric/Behavioral: Positive for depression and suicidal ideas. Negative for substance abuse. The patient is nervous/anxious.     Patient Active Problem List   Diagnosis Date Noted    . Bipolar 1 disorder, depressed, severe (HCC) 09/30/2017  . PCOS (polycystic ovarian syndrome) 02/23/2014  . Amenorrhea 11/28/2012  . Depression 11/28/2012    Social History   Tobacco Use  . Smoking status: Never Smoker  . Smokeless tobacco: Never Used  Substance Use Topics  . Alcohol use: No    Frequency: Never    Comment: Rarely    Current Outpatient Medications:  .  buPROPion (WELLBUTRIN XL) 300 MG 24 hr tablet, Take 1 tablet (300 mg total) by mouth daily. For mood ocntrol, Disp: 30 tablet, Rfl: 0 .  lamoTRIgine (LAMICTAL) 25 MG tablet, Take 125 mg by mouth daily., Disp: , Rfl:  .  LORazepam (ATIVAN) 0.5 MG tablet, Take 1 tablet (0.5 mg total) by mouth every 6 (six) hours as needed for anxiety., Disp: 30 tablet, Rfl: 0 .  zolpidem (AMBIEN) 5 MG tablet, Take 1 tablet (5 mg total) by mouth at bedtime as needed for sleep., Disp: 30 tablet, Rfl: 0  No Known Allergies  Objective:   VITALS: Per patient if applicable, see vitals. GENERAL: Alert, appears well and in no acute distress. HEENT: Atraumatic, conjunctiva clear, no obvious abnormalities on inspection of external nose and ears. NECK: Normal movements of the head and neck. CARDIOPULMONARY: No increased WOB. Speaking in clear sentences. I:E ratio WNL.  MS: Moves all visible extremities without noticeable abnormality. PSYCH: Pleasant and cooperative, well-groomed. Speech normal rate and rhythm. Affect is appropriate. Insight and judgement are appropriate. Attention is focused, linear, and appropriate.  NEURO: CN grossly intact. Oriented as arrived to appointment on time with no prompting. Moves both UE equally.  SKIN: No obvious lesions, wounds, erythema, or cyanosis noted on face or hands.  No  flowsheet data found.  Assessment and Plan:   Delva was seen today for referral follow up.  Diagnoses and all orders for this visit:  Bipolar 1 disorder, depressed, severe ([place]) Comments: Reviewed anxiety coping mechanisms.  Reassured patient that she will really like her new PsychD.     Marland Kitchen COVID-19 Education: The signs and symptoms of COVID-19 were discussed with the patient and how to seek care for testing if needed. The importance of social distancing was discussed today. . Reviewed expectations re: course of current medical issues. . Discussed self-management of symptoms. . Outlined signs and symptoms indicating need for more acute intervention. . Patient verbalized understanding and all questions were answered. Marland Kitchen Health Maintenance issues including appropriate healthy diet, exercise, and smoking avoidance were discussed with patient. . See orders for this visit as documented in the electronic medical record.  Briscoe Deutscher, DO  Records requested if needed. Time spent: 15 minutes, of which >50% was spent in obtaining information about her symptoms, reviewing her previous labs, evaluations, and treatments, counseling her about her condition (please see the discussed topics above), and developing a plan to further investigate it; she had a number of questions which I addressed.

## 2019-05-21 ENCOUNTER — Encounter: Payer: Self-pay | Admitting: Family Medicine

## 2019-05-22 ENCOUNTER — Ambulatory Visit (INDEPENDENT_AMBULATORY_CARE_PROVIDER_SITE_OTHER): Payer: BC Managed Care – PPO | Admitting: Clinical

## 2019-05-22 DIAGNOSIS — F3181 Bipolar II disorder: Secondary | ICD-10-CM | POA: Diagnosis not present

## 2019-06-06 ENCOUNTER — Ambulatory Visit (INDEPENDENT_AMBULATORY_CARE_PROVIDER_SITE_OTHER): Payer: BC Managed Care – PPO | Admitting: Clinical

## 2019-06-06 DIAGNOSIS — F3181 Bipolar II disorder: Secondary | ICD-10-CM

## 2019-06-07 ENCOUNTER — Ambulatory Visit: Payer: BC Managed Care – PPO | Admitting: Clinical

## 2019-06-14 ENCOUNTER — Ambulatory Visit (INDEPENDENT_AMBULATORY_CARE_PROVIDER_SITE_OTHER): Payer: BC Managed Care – PPO | Admitting: Clinical

## 2019-06-14 DIAGNOSIS — F3181 Bipolar II disorder: Secondary | ICD-10-CM

## 2019-06-15 ENCOUNTER — Telehealth: Payer: Self-pay | Admitting: *Deleted

## 2019-06-15 DIAGNOSIS — N6459 Other signs and symptoms in breast: Secondary | ICD-10-CM

## 2019-06-15 NOTE — Telephone Encounter (Signed)
Pt called with c/o's left breast mass that is near sternum.  It has made the breast tissue swollen and painful.  She has also started a period which she says that she never does unless she is on OCP's or Provera.  Diagnostic mammogram and U/S ordered and will discuss her bleeding with Dr Hulan Fray.Casey Glenn

## 2019-06-19 ENCOUNTER — Ambulatory Visit
Admission: RE | Admit: 2019-06-19 | Discharge: 2019-06-19 | Disposition: A | Discharge status: Home / Self Care | Payer: BC Managed Care – PPO | Source: Ambulatory Visit | Attending: Obstetrics & Gynecology | Admitting: Obstetrics & Gynecology

## 2019-06-19 ENCOUNTER — Other Ambulatory Visit: Payer: Self-pay

## 2019-06-19 DIAGNOSIS — N6459 Other signs and symptoms in breast: Secondary | ICD-10-CM

## 2019-06-20 ENCOUNTER — Ambulatory Visit (INDEPENDENT_AMBULATORY_CARE_PROVIDER_SITE_OTHER): Payer: BC Managed Care – PPO | Admitting: Clinical

## 2019-06-20 DIAGNOSIS — F3181 Bipolar II disorder: Secondary | ICD-10-CM | POA: Diagnosis not present

## 2019-06-28 ENCOUNTER — Ambulatory Visit (INDEPENDENT_AMBULATORY_CARE_PROVIDER_SITE_OTHER): Payer: BC Managed Care – PPO | Admitting: Clinical

## 2019-06-28 DIAGNOSIS — F3181 Bipolar II disorder: Secondary | ICD-10-CM

## 2019-06-29 ENCOUNTER — Encounter: Payer: Self-pay | Admitting: Obstetrics & Gynecology

## 2019-06-29 ENCOUNTER — Ambulatory Visit (INDEPENDENT_AMBULATORY_CARE_PROVIDER_SITE_OTHER): Payer: BC Managed Care – PPO | Admitting: Obstetrics & Gynecology

## 2019-06-29 ENCOUNTER — Other Ambulatory Visit: Payer: Self-pay

## 2019-06-29 ENCOUNTER — Ambulatory Visit: Payer: BC Managed Care – PPO | Admitting: Clinical

## 2019-06-29 VITALS — BP 122/74 | HR 66 | Ht 62.0 in | Wt 116.0 lb

## 2019-06-29 DIAGNOSIS — Z1151 Encounter for screening for human papillomavirus (HPV): Secondary | ICD-10-CM

## 2019-06-29 DIAGNOSIS — Z01419 Encounter for gynecological examination (general) (routine) without abnormal findings: Secondary | ICD-10-CM

## 2019-06-29 DIAGNOSIS — Z124 Encounter for screening for malignant neoplasm of cervix: Secondary | ICD-10-CM

## 2019-06-29 DIAGNOSIS — Z23 Encounter for immunization: Secondary | ICD-10-CM | POA: Diagnosis not present

## 2019-06-29 NOTE — Progress Notes (Signed)
Subjective:    Casey Glenn is a 33 y.o. married P0 female who presents for an annual exam. The patient has no complaints today. She had a spontaneous period last week, lasted for a week. She is using condoms.  The patient is sexually active. GYN screening history: last pap: was normal. The patient wears seatbelts: yes. The patient participates in regular exercise: yes. Has the patient ever been transfused or tattooed?: no. The patient reports that there is not domestic violence in her life.   Menstrual History: OB History    Gravida  0   Para      Term      Preterm      AB      Living        SAB      TAB      Ectopic      Multiple      Live Births              Menarche age: 52 Patient's last menstrual period was 06/14/2019.    The following portions of the patient's history were reviewed and updated as appropriate: allergies, current medications, past family history, past medical history, past social history, past surgical history and problem list.  Review of Systems Pertinent items are noted in HPI.   FH- no breast/gyn/colon cancer married   Objective:    BP 122/74   Pulse 66   Ht 5\' 2"  (1.575 m)   Wt 116 lb (52.6 kg)   LMP 06/14/2019   BMI 21.22 kg/m   General Appearance:    Alert, cooperative, no distress, appears stated age  Head:    Normocephalic, without obvious abnormality, atraumatic  Eyes:    PERRL, conjunctiva/corneas clear, EOM's intact, fundi    benign, both eyes  Ears:    Normal TM's and external ear canals, both ears  Nose:   Nares normal, septum midline, mucosa normal, no drainage    or sinus tenderness  Throat:   Lips, mucosa, and tongue normal; teeth and gums normal  Neck:   Supple, symmetrical, trachea midline, no adenopathy;    thyroid:  no enlargement/tenderness/nodules; no carotid   bruit or JVD  Back:     Symmetric, no curvature, ROM normal, no CVA tenderness  Lungs:     Clear to auscultation bilaterally, respirations  unlabored  Chest Wall:    No tenderness or deformity   Heart:    Regular rate and rhythm, S1 and S2 normal, no murmur, rub   or gallop  Breast Exam:    No tenderness, masses, or nipple abnormality  Abdomen:     Soft, non-tender, bowel sounds active all four quadrants,    no masses, no organomegaly  Genitalia:    Normal female without lesion, discharge or tenderness, nulliparous cervix.normal size and shape, retroverted, mobile, non-tender, normal adnexal exam, bedside u/s shows polycystic ovaries and an endometrial stripe of 2.4 mm.      Extremities:   Extremities normal, atraumatic, no cyanosis or edema  Pulses:   2+ and symmetric all extremities  Skin:   Skin color, texture, turgor normal, no rashes or lesions  Lymph nodes:   Cervical, supraclavicular, and axillary nodes normal  Neurologic:   CNII-XII intact, normal strength, sensation and reflexes    throughout  .    Assessment:    Healthy female exam.    Plan:     Thin prep Pap smear. with cotesting Flu vaccine today

## 2019-07-01 LAB — CYTOLOGY - PAP
Diagnosis: NEGATIVE
HPV: NOT DETECTED

## 2019-07-05 ENCOUNTER — Ambulatory Visit (INDEPENDENT_AMBULATORY_CARE_PROVIDER_SITE_OTHER): Payer: BC Managed Care – PPO | Admitting: Clinical

## 2019-07-05 DIAGNOSIS — F3181 Bipolar II disorder: Secondary | ICD-10-CM

## 2019-07-12 ENCOUNTER — Ambulatory Visit (INDEPENDENT_AMBULATORY_CARE_PROVIDER_SITE_OTHER): Payer: BC Managed Care – PPO | Admitting: Clinical

## 2019-07-12 DIAGNOSIS — F3181 Bipolar II disorder: Secondary | ICD-10-CM | POA: Diagnosis not present

## 2019-07-19 ENCOUNTER — Ambulatory Visit (INDEPENDENT_AMBULATORY_CARE_PROVIDER_SITE_OTHER): Payer: BC Managed Care – PPO | Admitting: Clinical

## 2019-07-19 DIAGNOSIS — F3181 Bipolar II disorder: Secondary | ICD-10-CM | POA: Diagnosis not present

## 2019-07-26 ENCOUNTER — Ambulatory Visit (INDEPENDENT_AMBULATORY_CARE_PROVIDER_SITE_OTHER): Payer: BC Managed Care – PPO | Admitting: Clinical

## 2019-07-26 DIAGNOSIS — F3181 Bipolar II disorder: Secondary | ICD-10-CM

## 2019-08-05 ENCOUNTER — Ambulatory Visit (INDEPENDENT_AMBULATORY_CARE_PROVIDER_SITE_OTHER): Payer: BC Managed Care – PPO | Admitting: Clinical

## 2019-08-05 DIAGNOSIS — F3181 Bipolar II disorder: Secondary | ICD-10-CM

## 2019-08-12 ENCOUNTER — Ambulatory Visit (INDEPENDENT_AMBULATORY_CARE_PROVIDER_SITE_OTHER): Payer: BC Managed Care – PPO | Admitting: Clinical

## 2019-08-12 DIAGNOSIS — F3181 Bipolar II disorder: Secondary | ICD-10-CM

## 2019-08-19 ENCOUNTER — Ambulatory Visit: Payer: Self-pay | Admitting: Clinical

## 2019-08-26 ENCOUNTER — Ambulatory Visit (INDEPENDENT_AMBULATORY_CARE_PROVIDER_SITE_OTHER): Payer: BC Managed Care – PPO | Admitting: Clinical

## 2019-08-26 DIAGNOSIS — F3181 Bipolar II disorder: Secondary | ICD-10-CM

## 2019-09-02 ENCOUNTER — Ambulatory Visit (INDEPENDENT_AMBULATORY_CARE_PROVIDER_SITE_OTHER): Payer: BC Managed Care – PPO | Admitting: Clinical

## 2019-09-02 DIAGNOSIS — F3181 Bipolar II disorder: Secondary | ICD-10-CM

## 2019-09-09 ENCOUNTER — Ambulatory Visit (INDEPENDENT_AMBULATORY_CARE_PROVIDER_SITE_OTHER): Payer: BC Managed Care – PPO | Admitting: Clinical

## 2019-09-09 DIAGNOSIS — F3181 Bipolar II disorder: Secondary | ICD-10-CM | POA: Diagnosis not present

## 2019-09-14 ENCOUNTER — Encounter: Payer: Self-pay | Admitting: Family Medicine

## 2019-09-16 ENCOUNTER — Ambulatory Visit (INDEPENDENT_AMBULATORY_CARE_PROVIDER_SITE_OTHER): Payer: BC Managed Care – PPO | Admitting: Clinical

## 2019-09-16 DIAGNOSIS — F3181 Bipolar II disorder: Secondary | ICD-10-CM | POA: Diagnosis not present

## 2019-09-18 ENCOUNTER — Encounter: Payer: BC Managed Care – PPO | Admitting: Family Medicine

## 2019-09-18 ENCOUNTER — Encounter: Payer: Self-pay | Admitting: Family Medicine

## 2019-09-18 ENCOUNTER — Ambulatory Visit (INDEPENDENT_AMBULATORY_CARE_PROVIDER_SITE_OTHER): Payer: BC Managed Care – PPO | Admitting: Family Medicine

## 2019-09-18 DIAGNOSIS — F32A Depression, unspecified: Secondary | ICD-10-CM

## 2019-09-18 DIAGNOSIS — F314 Bipolar disorder, current episode depressed, severe, without psychotic features: Secondary | ICD-10-CM | POA: Diagnosis not present

## 2019-09-18 DIAGNOSIS — F329 Major depressive disorder, single episode, unspecified: Secondary | ICD-10-CM

## 2019-09-18 NOTE — Assessment & Plan Note (Addendum)
Stable though patient is under quite a bit of stress at work due to COVID-19.  Had lengthy discussion with patient regarding her work environment and risk for COVID-19 and how this is influencing her mental health.  We will continue current medications.  She was advised to switch to virtual teaching by her psychiatrist and therapist.  Told patient I will be happy to write a letter in support of this if needed.

## 2019-09-18 NOTE — Progress Notes (Signed)
    Chief Complaint:  Casey Glenn is a 33 y.o. female who presents today for a virtual office visit with a chief complaint of depression and to transfer care  Assessment/Plan:  Depression Stable though patient is under quite a bit of stress at work due to COVID-19.  Had lengthy discussion with patient regarding her work environment and risk for COVID-19 and how this is influencing her mental health.  We will continue current medications.  She was advised to switch to virtual teaching by her psychiatrist and therapist.  Told patient I will be happy to write a letter in support of this if needed.   Bipolar 1 disorder, depressed, severe (HCC) Stable.  No signs of mania.  Continue Wellbutrin, Lamictal, and Ativan per psychiatry.    Subjective:  HPI:  Patient has been under significant stress.  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  She has been going back for in person classes for the past couple days.  She is very stressed about COVID-19.  She is stressed about acquiring COVID-19.  Also grandfather earlier this year to the virus.  Due to the nature of her work, it is hard very difficult for her to maintain social distancing or to maintain masking protocols.  Her stable, chronic medical conditions are outlined below:  # Depression / Bipolar 1 Disorder - Follows with psychiatry - On Wellbutrin 300mg  daily and lamictal 125mg  daily - Uses ativan 1mg  every 8 hours - ROS: No reported SI or HI  ROS: Per HPI  PMH: She reports that she has never smoked. She has never used smokeless tobacco. She reports that she does not drink alcohol or use drugs.      Objective/Observations  Physical Exam: Gen: NAD, resting comfortably Pulm: Normal work of breathing Neuro: Grossly normal, moves all extremities Psych: Normal affect and thought content  Virtual Visit via Video   I connected with Anarely Nicholls Michl on 09/18/19 at  3:40 PM EST by a video enabled telemedicine application and  verified that I am speaking with the correct person using two identifiers. I discussed the limitations of evaluation and management by telemedicine and the availability of in person appointments. The patient expressed understanding and agreed to proceed.   Patient location: Home Provider location: [place] participating in the virtual visit: Myself and Patient  Time Spent: I spent 25 minutes face-to-face with the patient, with more than half spent on counseling for COVID 19 precautions and  management plan for her depression and bipolar.      Algis Greenhouse. Jerline Pain, MD 09/18/2019 3:56 PM

## 2019-09-18 NOTE — Assessment & Plan Note (Signed)
Stable.  No signs of mania.  Continue Wellbutrin, Lamictal, and Ativan per psychiatry.

## 2019-09-23 ENCOUNTER — Ambulatory Visit (INDEPENDENT_AMBULATORY_CARE_PROVIDER_SITE_OTHER): Payer: BC Managed Care – PPO | Admitting: Clinical

## 2019-09-23 DIAGNOSIS — F3181 Bipolar II disorder: Secondary | ICD-10-CM

## 2019-09-30 ENCOUNTER — Ambulatory Visit: Payer: Self-pay | Admitting: Clinical

## 2019-09-30 ENCOUNTER — Ambulatory Visit: Payer: BC Managed Care – PPO | Admitting: Clinical

## 2019-10-07 ENCOUNTER — Ambulatory Visit (INDEPENDENT_AMBULATORY_CARE_PROVIDER_SITE_OTHER): Payer: BC Managed Care – PPO | Admitting: Clinical

## 2019-10-07 DIAGNOSIS — F3181 Bipolar II disorder: Secondary | ICD-10-CM

## 2019-10-14 ENCOUNTER — Ambulatory Visit (INDEPENDENT_AMBULATORY_CARE_PROVIDER_SITE_OTHER): Payer: BC Managed Care – PPO | Admitting: Clinical

## 2019-10-14 DIAGNOSIS — F3181 Bipolar II disorder: Secondary | ICD-10-CM | POA: Diagnosis not present

## 2019-10-21 ENCOUNTER — Ambulatory Visit (INDEPENDENT_AMBULATORY_CARE_PROVIDER_SITE_OTHER): Payer: BC Managed Care – PPO | Admitting: Clinical

## 2019-10-21 DIAGNOSIS — F3181 Bipolar II disorder: Secondary | ICD-10-CM | POA: Diagnosis not present

## 2019-10-28 ENCOUNTER — Ambulatory Visit: Payer: Self-pay | Admitting: Clinical

## 2019-11-04 ENCOUNTER — Ambulatory Visit: Payer: Self-pay | Admitting: Clinical

## 2019-11-18 ENCOUNTER — Ambulatory Visit (INDEPENDENT_AMBULATORY_CARE_PROVIDER_SITE_OTHER): Payer: BC Managed Care – PPO | Admitting: Clinical

## 2019-11-18 DIAGNOSIS — F3181 Bipolar II disorder: Secondary | ICD-10-CM

## 2019-11-25 ENCOUNTER — Ambulatory Visit (INDEPENDENT_AMBULATORY_CARE_PROVIDER_SITE_OTHER): Payer: BC Managed Care – PPO | Admitting: Clinical

## 2019-11-25 DIAGNOSIS — F3181 Bipolar II disorder: Secondary | ICD-10-CM

## 2019-12-02 ENCOUNTER — Ambulatory Visit (INDEPENDENT_AMBULATORY_CARE_PROVIDER_SITE_OTHER): Payer: BC Managed Care – PPO | Admitting: Clinical

## 2019-12-02 DIAGNOSIS — F3181 Bipolar II disorder: Secondary | ICD-10-CM | POA: Diagnosis not present

## 2019-12-09 ENCOUNTER — Ambulatory Visit (INDEPENDENT_AMBULATORY_CARE_PROVIDER_SITE_OTHER): Payer: BC Managed Care – PPO | Admitting: Clinical

## 2019-12-09 DIAGNOSIS — F3181 Bipolar II disorder: Secondary | ICD-10-CM

## 2019-12-14 ENCOUNTER — Ambulatory Visit: Payer: BC Managed Care – PPO | Attending: Internal Medicine

## 2019-12-14 ENCOUNTER — Ambulatory Visit (INDEPENDENT_AMBULATORY_CARE_PROVIDER_SITE_OTHER): Payer: BC Managed Care – PPO | Admitting: Family Medicine

## 2019-12-14 ENCOUNTER — Encounter: Payer: Self-pay | Admitting: Family Medicine

## 2019-12-14 DIAGNOSIS — Z20822 Contact with and (suspected) exposure to covid-19: Secondary | ICD-10-CM

## 2019-12-14 DIAGNOSIS — R52 Pain, unspecified: Secondary | ICD-10-CM | POA: Diagnosis not present

## 2019-12-14 NOTE — Progress Notes (Signed)
   Casey Glenn is a 34 y.o. female who presents today for a virtual office visit.  Assessment/Plan:  New/Acute Problems: Fever Concern for Covid.  Covid test is pending.  Discussed conservative measures including good oral hydration.  She can continue using Tylenol 1000mg  3 times daily as needed.  Also recommended Flonase.  Discussed warning signs and reasons to return to care.    Subjective:  HPI:  Patient is concerned she may have Covid.  Her symptoms started 3 days ago and include fevers, chills, body aches, headaches, and loss of taste.  She can still smell.  She has some difficulty with deep inspiration.  No shortness of breath.  No chest pain.  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  She works as a K through 2.  She has noted that she has one student in particular that is unwilling to wear a mask.      Objective/Observations  Physical Exam: Gen: NAD, resting comfortably Pulm: Normal work of breathing Neuro: Grossly normal, moves all extremities Psych: Normal affect and thought content  Virtual Visit via Video   I connected with Casey Glenn on 12/14/19 at  4:00 PM EST by a video enabled telemedicine application and verified that I am speaking with the correct person using two identifiers. The limitations of evaluation and management by telemedicine and the availability of in person appointments were discussed. The patient expressed understanding and agreed to proceed.   Patient location: Home Provider location: [facility] 02/11/20 Persons participating in the virtual visit: Myself and Patient     [employer]. Katina Degree, MD 12/14/2019 10:34 AM

## 2019-12-15 ENCOUNTER — Telehealth: Payer: Self-pay | Admitting: Family Medicine

## 2019-12-15 LAB — NOVEL CORONAVIRUS, NAA: SARS-CoV-2, NAA: NOT DETECTED

## 2019-12-15 NOTE — Telephone Encounter (Signed)
Noted. Agree with plan.  Katina Degree. [name], MD 12/15/2019 3:41 PM

## 2019-12-15 NOTE — Telephone Encounter (Signed)
Pt called stating she was experiencing chest tightness and shortness of breath. Pt was seen by Dr. [name] yesterday for COVID symptoms. Transferred pt to nurse triage line. Nurse advised pt to go to ED. Pt did not go to ED and stated COVID test came back negative. Pt said she took Ativan and it helped relieve some of the tightness in her chest.

## 2019-12-15 NOTE — Telephone Encounter (Signed)
FYI

## 2019-12-15 NOTE — Telephone Encounter (Signed)
Informed patient of CDC guidelines to exposure to Covid 19, Immediately after identification as a contact to exposure,If tested and if negative, could be tested again about 5-7 days after last exposure.

## 2019-12-16 ENCOUNTER — Ambulatory Visit (INDEPENDENT_AMBULATORY_CARE_PROVIDER_SITE_OTHER): Payer: BC Managed Care – PPO | Admitting: Clinical

## 2019-12-16 ENCOUNTER — Telehealth: Payer: Self-pay | Admitting: Family Medicine

## 2019-12-16 DIAGNOSIS — F3181 Bipolar II disorder: Secondary | ICD-10-CM | POA: Diagnosis not present

## 2019-12-16 NOTE — Telephone Encounter (Signed)
Patient requesting another letter stating having symptoms. Also having concerns if test was done inaccurate due to how she was swabbed,they did not go up her nose just midway.

## 2019-12-16 NOTE — Telephone Encounter (Signed)
Patient called in this morning saying she is still having covid symptoms but her test came back negative, she asked if it could possibly be inaccurate. Asked for a returned call for more information

## 2019-12-17 ENCOUNTER — Encounter: Payer: Self-pay | Admitting: Family Medicine

## 2019-12-17 NOTE — Telephone Encounter (Signed)
Spoke with patient new letter written,she will be retested for Covid 19 12/18/2019.I informed patient if she feel that they did not go up far enough,it ok if she ask for them to get a good swab.

## 2019-12-18 ENCOUNTER — Other Ambulatory Visit: Payer: BC Managed Care – PPO

## 2019-12-18 ENCOUNTER — Ambulatory Visit: Payer: BC Managed Care – PPO | Attending: Internal Medicine

## 2019-12-18 DIAGNOSIS — Z20822 Contact with and (suspected) exposure to covid-19: Secondary | ICD-10-CM

## 2019-12-19 LAB — NOVEL CORONAVIRUS, NAA: SARS-CoV-2, NAA: NOT DETECTED

## 2019-12-21 ENCOUNTER — Telehealth: Payer: Self-pay | Admitting: Family Medicine

## 2019-12-21 ENCOUNTER — Ambulatory Visit (INDEPENDENT_AMBULATORY_CARE_PROVIDER_SITE_OTHER): Payer: BC Managed Care – PPO | Admitting: Family Medicine

## 2019-12-21 ENCOUNTER — Encounter: Payer: Self-pay | Admitting: Family Medicine

## 2019-12-21 ENCOUNTER — Other Ambulatory Visit: Payer: Self-pay

## 2019-12-21 DIAGNOSIS — M791 Myalgia, unspecified site: Secondary | ICD-10-CM

## 2019-12-21 NOTE — Progress Notes (Signed)
   Casey Glenn is a 34 y.o. female who presents today for a virtual office visit.  Assessment/Plan:  New/Acute Problems: Myalgias Had lengthy discussion with patient regarding optimal timing in terms of going back to work.  Per CDC guidelines she is already met the minimum of time off though still has some physical symptoms.  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  We will give work note for her to return to work on 12/23/2019.     Subjective:  HPI:  Patient seen a week ago for viral URI symptoms.  Was concerned for COVID-19.  She has had 2 test which are negative.  Symptoms started 11 days ago.  She was feeling a little bit better today.  She is wondering when she should go back to work.        Objective/Observations  Physical Exam: Gen: NAD, resting comfortably Pulm: Normal work of breathing Neuro: Grossly normal, moves all extremities Psych: Normal affect and thought content  Virtual Visit via Video   I connected with Casey Glenn on 12/21/19 at  4:00 PM EST by a video enabled telemedicine application and verified that I am speaking with the correct person using two identifiers. The limitations of evaluation and management by telemedicine and the availability of in person appointments were discussed. The patient expressed understanding and agreed to proceed.   Patient location: Home Provider location: [facility] Persons participating in the virtual visit: Myself and Patient  Time Spent: 21 minutes of total time was spent on the date of the encounter performing the following actions: chart review prior to seeing the patient, obtaining history, performing a medically necessary exam, counseling on the treatment plan, placing orders, and documenting in our EHR.       Algis Greenhouse. Jerline Pain, MD 12/21/2019 10:14 AM

## 2019-12-21 NOTE — Telephone Encounter (Signed)
Patient would like to return to work tomorrow instead of Wednesday. Can the work note be updated to reflect 12/22/19 as her return to work date?

## 2019-12-21 NOTE — Telephone Encounter (Signed)
Letter sent via my chart

## 2019-12-23 ENCOUNTER — Ambulatory Visit (INDEPENDENT_AMBULATORY_CARE_PROVIDER_SITE_OTHER): Payer: BC Managed Care – PPO | Admitting: Clinical

## 2019-12-23 DIAGNOSIS — F3181 Bipolar II disorder: Secondary | ICD-10-CM

## 2019-12-25 ENCOUNTER — Telehealth: Payer: Self-pay | Admitting: Family Medicine

## 2019-12-25 NOTE — Telephone Encounter (Signed)
Patient called in this morning saying she said she is having a lingering SOB she states its only when she is walking around/vaccuming and when it comes on it just feels like she cant catch her breath, offered patient to speak with a triage nurse and she declined stating they just tell her to go to the ED but states she does not feel like she needs to go. Just wants to know if this is normal and asked for advise. Asked for a return call after 230

## 2019-12-25 NOTE — Telephone Encounter (Signed)
Patient stated that she is still experiencing tightness in chest and its not worsening,nor getting better.

## 2019-12-25 NOTE — Telephone Encounter (Signed)
Please advise 

## 2019-12-25 NOTE — Telephone Encounter (Signed)
After any infection can have lingering cough or shortness of breath. It should improve over the coming weeks. Would like for her to let us know if it worsens or does not continue to improve.  Casey Glenn. [name], MD 12/25/2019 2:14 PM

## 2019-12-28 ENCOUNTER — Ambulatory Visit (INDEPENDENT_AMBULATORY_CARE_PROVIDER_SITE_OTHER): Payer: BC Managed Care – PPO

## 2019-12-28 ENCOUNTER — Other Ambulatory Visit: Payer: BC Managed Care – PPO

## 2019-12-28 ENCOUNTER — Other Ambulatory Visit: Payer: Self-pay

## 2019-12-28 DIAGNOSIS — R0602 Shortness of breath: Secondary | ICD-10-CM

## 2019-12-28 NOTE — Telephone Encounter (Signed)
Patient notified she will call to schedule appt 

## 2019-12-28 NOTE — Telephone Encounter (Signed)
Recommend patient get a chest xray. Ok for her to come here since she has had 2 negative COVID tests or she can go to [facility].   Casey Glenn. [name], MD 12/28/2019 8:02 AM

## 2019-12-29 NOTE — Progress Notes (Signed)
Please inform patient of the following:  She has slight fullness in the central part of her lungs. I think this is mostly part of her body's recovery process from the recent infection, but if she is still having ongoing shortness of breath, we should get her to see pulmonology for lung function testing. Recommend referral to pulm if she is willing.  Casey Glenn. [name], MD 12/29/2019 8:08 AM

## 2019-12-30 ENCOUNTER — Ambulatory Visit (INDEPENDENT_AMBULATORY_CARE_PROVIDER_SITE_OTHER): Payer: BC Managed Care – PPO | Admitting: Clinical

## 2019-12-30 DIAGNOSIS — F3181 Bipolar II disorder: Secondary | ICD-10-CM

## 2020-01-06 ENCOUNTER — Ambulatory Visit (INDEPENDENT_AMBULATORY_CARE_PROVIDER_SITE_OTHER): Payer: BC Managed Care – PPO | Admitting: Clinical

## 2020-01-06 DIAGNOSIS — F3181 Bipolar II disorder: Secondary | ICD-10-CM | POA: Diagnosis not present

## 2020-01-13 ENCOUNTER — Ambulatory Visit (INDEPENDENT_AMBULATORY_CARE_PROVIDER_SITE_OTHER): Payer: BC Managed Care – PPO | Admitting: Clinical

## 2020-01-13 DIAGNOSIS — F3181 Bipolar II disorder: Secondary | ICD-10-CM | POA: Diagnosis not present

## 2020-01-20 ENCOUNTER — Ambulatory Visit (INDEPENDENT_AMBULATORY_CARE_PROVIDER_SITE_OTHER): Payer: BC Managed Care – PPO | Admitting: Clinical

## 2020-01-20 DIAGNOSIS — F3181 Bipolar II disorder: Secondary | ICD-10-CM

## 2020-01-27 ENCOUNTER — Ambulatory Visit (INDEPENDENT_AMBULATORY_CARE_PROVIDER_SITE_OTHER): Payer: BC Managed Care – PPO | Admitting: Clinical

## 2020-01-27 DIAGNOSIS — F3181 Bipolar II disorder: Secondary | ICD-10-CM | POA: Diagnosis not present

## 2020-02-01 ENCOUNTER — Ambulatory Visit: Payer: BC Managed Care – PPO | Attending: Internal Medicine

## 2020-02-01 DIAGNOSIS — Z20822 Contact with and (suspected) exposure to covid-19: Secondary | ICD-10-CM

## 2020-02-02 LAB — SARS-COV-2, NAA 2 DAY TAT

## 2020-02-02 LAB — NOVEL CORONAVIRUS, NAA: SARS-CoV-2, NAA: NOT DETECTED

## 2020-02-03 ENCOUNTER — Ambulatory Visit (INDEPENDENT_AMBULATORY_CARE_PROVIDER_SITE_OTHER): Payer: BC Managed Care – PPO | Admitting: Clinical

## 2020-02-03 DIAGNOSIS — F3181 Bipolar II disorder: Secondary | ICD-10-CM

## 2020-02-10 ENCOUNTER — Ambulatory Visit (INDEPENDENT_AMBULATORY_CARE_PROVIDER_SITE_OTHER): Payer: BC Managed Care – PPO | Admitting: Clinical

## 2020-02-10 DIAGNOSIS — F3181 Bipolar II disorder: Secondary | ICD-10-CM | POA: Diagnosis not present

## 2020-02-17 ENCOUNTER — Ambulatory Visit (INDEPENDENT_AMBULATORY_CARE_PROVIDER_SITE_OTHER): Payer: BC Managed Care – PPO | Admitting: Clinical

## 2020-02-17 DIAGNOSIS — F3181 Bipolar II disorder: Secondary | ICD-10-CM | POA: Diagnosis not present

## 2020-02-24 ENCOUNTER — Ambulatory Visit: Payer: BC Managed Care – PPO | Admitting: Clinical

## 2020-03-02 ENCOUNTER — Ambulatory Visit: Payer: BC Managed Care – PPO | Admitting: Clinical

## 2020-03-09 ENCOUNTER — Ambulatory Visit (INDEPENDENT_AMBULATORY_CARE_PROVIDER_SITE_OTHER): Payer: BC Managed Care – PPO | Admitting: Clinical

## 2020-03-09 DIAGNOSIS — F3181 Bipolar II disorder: Secondary | ICD-10-CM

## 2020-03-16 ENCOUNTER — Ambulatory Visit (INDEPENDENT_AMBULATORY_CARE_PROVIDER_SITE_OTHER): Payer: BC Managed Care – PPO | Admitting: Clinical

## 2020-03-16 DIAGNOSIS — F3181 Bipolar II disorder: Secondary | ICD-10-CM | POA: Diagnosis not present

## 2020-03-23 ENCOUNTER — Ambulatory Visit: Payer: BC Managed Care – PPO | Admitting: Clinical

## 2020-03-30 ENCOUNTER — Ambulatory Visit (INDEPENDENT_AMBULATORY_CARE_PROVIDER_SITE_OTHER): Payer: BC Managed Care – PPO | Admitting: Clinical

## 2020-03-30 DIAGNOSIS — F3181 Bipolar II disorder: Secondary | ICD-10-CM

## 2020-04-06 ENCOUNTER — Ambulatory Visit (INDEPENDENT_AMBULATORY_CARE_PROVIDER_SITE_OTHER): Payer: BC Managed Care – PPO | Admitting: Clinical

## 2020-04-06 DIAGNOSIS — F3181 Bipolar II disorder: Secondary | ICD-10-CM

## 2020-04-12 ENCOUNTER — Ambulatory Visit (INDEPENDENT_AMBULATORY_CARE_PROVIDER_SITE_OTHER): Payer: BC Managed Care – PPO | Admitting: Family Medicine

## 2020-04-12 ENCOUNTER — Encounter: Payer: Self-pay | Admitting: Family Medicine

## 2020-04-12 ENCOUNTER — Other Ambulatory Visit: Payer: Self-pay

## 2020-04-12 VITALS — BP 110/76 | HR 74 | Temp 98.2°F | Ht 62.0 in | Wt 116.2 lb

## 2020-04-12 DIAGNOSIS — F329 Major depressive disorder, single episode, unspecified: Secondary | ICD-10-CM | POA: Diagnosis not present

## 2020-04-12 DIAGNOSIS — E282 Polycystic ovarian syndrome: Secondary | ICD-10-CM

## 2020-04-12 DIAGNOSIS — R5383 Other fatigue: Secondary | ICD-10-CM | POA: Diagnosis not present

## 2020-04-12 DIAGNOSIS — Z0001 Encounter for general adult medical examination with abnormal findings: Secondary | ICD-10-CM | POA: Diagnosis not present

## 2020-04-12 DIAGNOSIS — Z13220 Encounter for screening for lipoid disorders: Secondary | ICD-10-CM | POA: Diagnosis not present

## 2020-04-12 DIAGNOSIS — F32A Depression, unspecified: Secondary | ICD-10-CM

## 2020-04-12 DIAGNOSIS — F314 Bipolar disorder, current episode depressed, severe, without psychotic features: Secondary | ICD-10-CM

## 2020-04-12 LAB — CBC
HCT: 41.9 % (ref 36.0–46.0)
Hemoglobin: 14.6 g/dL (ref 12.0–15.0)
MCHC: 35 g/dL (ref 30.0–36.0)
MCV: 88.2 fl (ref 78.0–100.0)
Platelets: 304 10*3/uL (ref 150.0–400.0)
RBC: 4.75 Mil/uL (ref 3.87–5.11)
RDW: 12.9 % (ref 11.5–15.5)
WBC: 7.6 10*3/uL (ref 4.0–10.5)

## 2020-04-12 LAB — LIPID PANEL
Cholesterol: 158 mg/dL (ref 0–200)
HDL: 69.1 mg/dL (ref >39.00)
LDL Cholesterol: 75 mg/dL (ref 0–99)
NonHDL: 88.8
Total CHOL/HDL Ratio: 2
Triglycerides: 68 mg/dL (ref 0.0–149.0)
VLDL: 13.6 mg/dL (ref 0.0–40.0)

## 2020-04-12 LAB — VITAMIN B12: Vitamin B-12: 419 pg/mL (ref 211–911)

## 2020-04-12 LAB — COMPREHENSIVE METABOLIC PANEL
ALT: 15 U/L (ref 0–35)
AST: 23 U/L (ref 0–37)
Albumin: 4.6 g/dL (ref 3.5–5.2)
Alkaline Phosphatase: 61 U/L (ref 39–117)
BUN: 7 mg/dL (ref 6–23)
CO2: 29 mEq/L (ref 19–32)
Calcium: 9.7 mg/dL (ref 8.4–10.5)
Chloride: 101 mEq/L (ref 96–112)
Creatinine, Ser: 0.86 mg/dL (ref 0.40–1.20)
GFR: 75.2 mL/min (ref >60.00)
Glucose, Bld: 72 mg/dL (ref 70–99)
Potassium: 3.9 mEq/L (ref 3.5–5.1)
Sodium: 136 mEq/L (ref 135–145)
Total Bilirubin: 0.5 mg/dL (ref 0.2–1.2)
Total Protein: 6.5 g/dL (ref 6.0–8.3)

## 2020-04-12 LAB — TSH: TSH: 2.61 u[IU]/mL (ref 0.35–4.50)

## 2020-04-12 LAB — VITAMIN D 25 HYDROXY (VIT D DEFICIENCY, FRACTURES): VITD: 34.47 ng/mL (ref 30.00–100.00)

## 2020-04-12 NOTE — Patient Instructions (Addendum)
It was very nice to see you today!  We will check blood work and urine sample today to look for causes of your fatigue.  Keep up the good work with your diet and exercise.  We will see back in a year for your next checkup, or sooner if needed.  Take care,  Dr Jerline Pain  Please try these tips to maintain a healthy lifestyle:   Eat at least 3 REAL meals and 1-2 snacks per day.  Aim for no more than 5 hours between eating.  If you eat breakfast, please do so within one hour of getting up.    Each meal should contain half fruits/vegetables, one quarter protein, and one quarter carbs (no bigger than a computer mouse)   Cut down on sweet beverages. This includes juice, soda, and sweet tea.     Drink at least 1 glass of water with each meal and aim for at least 8 glasses per day   Exercise at least 150 minutes every week.    Preventive Care 49-19 Years Old, Female Preventive care refers to visits with your health care provider and lifestyle choices that can promote health and wellness. This includes:  A yearly physical exam. This may also be called an annual well check.  Regular dental visits and eye exams.  Immunizations.  Screening for certain conditions.  Healthy lifestyle choices, such as eating a healthy diet, getting regular exercise, not using drugs or products that contain nicotine and tobacco, and limiting alcohol use. What can I expect for my preventive care visit? Physical exam Your health care provider will check your:  Height and weight. This may be used to calculate body mass index (BMI), which tells if you are at a healthy weight.  Heart rate and blood pressure.  Skin for abnormal spots. Counseling Your health care provider may ask you questions about your:  Alcohol, tobacco, and drug use.  Emotional well-being.  Home and relationship well-being.  Sexual activity.  Eating habits.  Work and work Statistician.  Method of birth control.  Menstrual  cycle.  Pregnancy history. What immunizations do I need?  Influenza (flu) vaccine  This is recommended every year. Tetanus, diphtheria, and pertussis (Tdap) vaccine  You may need a Td booster every 10 years. Varicella (chickenpox) vaccine  You may need this if you have not been vaccinated. Human papillomavirus (HPV) vaccine  If recommended by your health care provider, you may need three doses over 6 months. Measles, mumps, and rubella (MMR) vaccine  You may need at least one dose of MMR. You may also need a second dose. Meningococcal conjugate (MenACWY) vaccine  One dose is recommended if you are age 91-21 years and a first-year college student living in a residence hall, or if you have one of several medical conditions. You may also need additional booster doses. Pneumococcal conjugate (PCV13) vaccine  You may need this if you have certain conditions and were not previously vaccinated. Pneumococcal polysaccharide (PPSV23) vaccine  You may need one or two doses if you smoke cigarettes or if you have certain conditions. Hepatitis A vaccine  You may need this if you have certain conditions or if you travel or work in places where you may be exposed to hepatitis A. Hepatitis B vaccine  You may need this if you have certain conditions or if you travel or work in places where you may be exposed to hepatitis B. Haemophilus influenzae type b (Hib) vaccine  You may need this if you have  certain conditions. You may receive vaccines as individual doses or as more than one vaccine together in one shot (combination vaccines). Talk with your health care provider about the risks and benefits of combination vaccines. What tests do I need?  Blood tests  Lipid and cholesterol levels. These may be checked every 5 years starting at age 93.  Hepatitis C test.  Hepatitis B test. Screening  Diabetes screening. This is done by checking your blood sugar (glucose) after you have not eaten  for a while (fasting).  Sexually transmitted disease (STD) testing.  BRCA-related cancer screening. This may be done if you have a family history of breast, ovarian, tubal, or peritoneal cancers.  Pelvic exam and Pap test. This may be done every 3 years starting at age 70. Starting at age 54, this may be done every 5 years if you have a Pap test in combination with an HPV test. Talk with your health care provider about your test results, treatment options, and if necessary, the need for more tests. Follow these instructions at home: Eating and drinking   Eat a diet that includes fresh fruits and vegetables, whole grains, lean protein, and low-fat dairy.  Take vitamin and mineral supplements as recommended by your health care provider.  Do not drink alcohol if: ? Your health care provider tells you not to drink. ? You are pregnant, may be pregnant, or are planning to become pregnant.  If you drink alcohol: ? Limit how much you have to 0-1 drink a day. ? Be aware of how much alcohol is in your drink. In the [place] one drink equals one 12 oz bottle of beer (355 mL), one 5 oz glass of wine (148 mL), or one 1 oz glass of hard liquor (44 mL). Lifestyle  Take daily care of your teeth and gums.  Stay active. Exercise for at least 30 minutes on 5 or more days each week.  Do not use any products that contain nicotine or tobacco, such as cigarettes, e-cigarettes, and chewing tobacco. If you need help quitting, ask your health care provider.  If you are sexually active, practice safe sex. Use a condom or other form of birth control (contraception) in order to prevent pregnancy and STIs (sexually transmitted infections). If you plan to become pregnant, see your health care provider for a preconception visit. What's next?  Visit your health care provider once a year for a well check visit.  Ask your health care provider how often you should have your eyes and teeth checked.  Stay up to date  on all vaccines. This information is not intended to replace advice given to you by your health care provider. Make sure you discuss any questions you have with your health care provider. Document Revised: 07/03/2018 Document Reviewed: 07/03/2018 Elsevier Patient Education  2020 [employer].

## 2020-04-12 NOTE — Assessment & Plan Note (Signed)
Continue management per OB/GYN. 

## 2020-04-12 NOTE — Progress Notes (Signed)
Chief Complaint:  Casey Glenn is a 34 y.o. female who presents today for her annual comprehensive physical exam.    Assessment/Plan:  New/Acute Problems: Fatigue Unclear etiology.  Probably multifactorial.  Will check CBC, C met, TSH, B12, and vitamin D.  She has slight dyspnea on exertion but normal lung exam today.  We will consider referral to pulmonology for spirometry if her labs are normal and she continues to have issues.  Chronic Problems Addressed Today: PCOS (polycystic ovarian syndrome) Continue management per OB/GYN.  Depression Stable.  Continue management per psychiatry.  Bipolar 1 disorder, depressed, severe (HCC) Stable.  Continue management per psychiatry.  Preventative Healthcare: Gets Pap smear done through OB/GYN.  Will check CBC CMP, TSH, and lipid panel today.  Patient Counseling(The following topics were reviewed and/or handout was given):  -Nutrition: Stressed importance of moderation in sodium/caffeine intake, saturated fat and cholesterol, caloric balance, sufficient intake of fresh fruits, vegetables, and fiber.  -Stressed the importance of regular exercise.   -Substance Abuse: Discussed cessation/primary prevention of tobacco, alcohol, or other drug use; driving or other dangerous activities under the influence; availability of treatment for abuse.   -Injury prevention: Discussed safety belts, safety helmets, smoke detector, smoking near bedding or upholstery.   -Sexuality: Discussed sexually transmitted diseases, partner selection, use of condoms, avoidance of unintended pregnancy and contraceptive alternatives.   -Dental health: Discussed importance of regular tooth brushing, flossing, and dental visits.  -Health maintenance and immunizations reviewed. Please refer to Health maintenance section.  Return to care in 1 year for next preventative visit.     Subjective:  HPI:  She has no acute complaints today.   She has had ongoing fatigue for  the last several months. She had URI symptoms about 4 months ago. COVID test was negative. She has noticed midly increased shortness of breath with exertion. Does not feel refreshed after waking up. Occasionally snores. Does not doze.   Lifestyle Diet: Balanced. Vegan. Lots of fruits and vegetables.  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 Likes jogging and riding bikes.   No flowsheet data found.  Health Maintenance Due  Topic Date Due  . Hepatitis C Screening  Never done     ROS: Per HPI, otherwise a complete review of systems was negative.   PMH:  The following were reviewed and entered/updated in epic: Past Medical History:  Diagnosis Date  . Amenorrhea following discontinuation of oral contraceptive use    none since 2013  . Bipolar 1 disorder ([place])   . Bipolar 1 disorder ([place])   . Suicide attempt [facility])    Patient Active Problem List   Diagnosis Date Noted  . Bipolar 1 disorder, depressed, severe ([place]) 09/30/2017  . PCOS (polycystic ovarian syndrome) 02/23/2014  . Amenorrhea 11/28/2012  . Depression 11/28/2012   Past Surgical History:  Procedure Laterality Date  . WISDOM TOOTH EXTRACTION      Family History  Problem Relation Age of Onset  . Mood Disorder Father   . Hyperlipidemia Maternal Grandmother   . Hyperlipidemia Paternal Grandmother   . Mood Disorder Paternal Grandfather     Medications- reviewed and updated Current Outpatient Medications  Medication Sig Dispense Refill  . buPROPion (WELLBUTRIN XL) 300 MG 24 hr tablet Take 1 tablet (300 mg total) by mouth daily. For mood ocntrol 30 tablet 0  . lamoTRIgine (LAMICTAL) 25 MG tablet Take 125 mg by mouth daily.    Marland Kitchen LORazepam (ATIVAN) 1 MG tablet Take 1 mg by mouth every  8 (eight) hours.     No current facility-administered medications for this visit.    Allergies-reviewed and updated No Known Allergies  Social History   Socioeconomic History  . Marital status: Married    Spouse name: Not on  file  . Number of children: Not on file  . Years of education: Not on file  . Highest education level: Not on file  Occupational History  . Not on file  Tobacco Use  . Smoking status: Never Smoker  . Smokeless tobacco: Never Used  Substance and Sexual Activity  . Alcohol use: No    Comment: Rarely  . Drug use: No  . Sexual activity: Yes    Birth control/protection: Condom  Other Topics Concern  . Not on file  Social History Narrative  . Not on file   Social Determinants of Health   Financial Resource Strain:   . Difficulty of Paying Living Expenses:   Food Insecurity:   . Worried About Charity fundraiser in the Last Year:   . Arboriculturist in the Last Year:   Transportation Needs:   . Film/video editor (Medical):   Marland Kitchen Lack of Transportation (Non-Medical):   Physical Activity:   . Days of Exercise per Week:   . Minutes of Exercise per Session:   Stress:   . Feeling of Stress :   Social Connections:   . Frequency of Communication with Friends and Family:   . Frequency of Social Gatherings with Friends and Family:   . Attends Religious Services:   . Active Member of Clubs or Organizations:   . Attends Archivist Meetings:   Marland Kitchen Marital Status:         Objective:  Physical Exam: BP 110/76   Pulse 74   Temp 98.2 F (36.8 C)   Ht 5' 2"  (1.575 m)   Wt 116 lb 4 oz (52.7 kg)   SpO2 100%   BMI 21.26 kg/m   Body mass index is 21.26 kg/m. Wt Readings from Last 3 Encounters:  04/12/20 116 lb 4 oz (52.7 kg)  09/18/19 116 lb (52.6 kg)  06/29/19 116 lb (52.6 kg)   Gen: NAD, resting comfortably HEENT: TMs normal bilaterally. OP clear. No thyromegaly noted.  CV: RRR with no murmurs appreciated Pulm: NWOB, CTAB with no crackles, wheezes, or rhonchi GI: Normal bowel sounds present. Soft, Nontender, Nondistended. MSK: no edema, cyanosis, or clubbing noted Skin: warm, dry Neuro: CN2-12 grossly intact. Strength 5/5 in upper and lower extremities. Reflexes  symmetric and intact bilaterally.  Psych: Normal affect and thought content     Thurston Brendlinger M. Jerline Pain, MD 04/12/2020 1:39 PM

## 2020-04-12 NOTE — Assessment & Plan Note (Signed)
Stable.  Continue management per psychiatry. 

## 2020-04-13 ENCOUNTER — Ambulatory Visit (INDEPENDENT_AMBULATORY_CARE_PROVIDER_SITE_OTHER): Payer: BC Managed Care – PPO | Admitting: Clinical

## 2020-04-13 DIAGNOSIS — F3181 Bipolar II disorder: Secondary | ICD-10-CM

## 2020-04-13 LAB — URINALYSIS, ROUTINE W REFLEX MICROSCOPIC
Bilirubin Urine: NEGATIVE
Hgb urine dipstick: NEGATIVE
Ketones, ur: NEGATIVE
Leukocytes,Ua: NEGATIVE
Nitrite: NEGATIVE
RBC / HPF: NONE SEEN (ref 0–?)
Specific Gravity, Urine: <=1.005 — AB (ref 1.000–1.030)
Total Protein, Urine: NEGATIVE
Urine Glucose: NEGATIVE
Urobilinogen, UA: 0.2 (ref 0.0–1.0)
WBC, UA: NONE SEEN (ref 0–?)
pH: 6 (ref 5.0–8.0)

## 2020-04-13 NOTE — Progress Notes (Signed)
Please inform patient of the following:  Blood work is all normal.  No clear explanation for her decreased exercise tolerance.  Could be result of URI  infection earlier this year.  Should improve over time but we can place referral to pulmonology for lung function testing if she wishes as we discussed at her office visit.

## 2020-04-20 ENCOUNTER — Ambulatory Visit (INDEPENDENT_AMBULATORY_CARE_PROVIDER_SITE_OTHER): Payer: BC Managed Care – PPO | Admitting: Clinical

## 2020-04-20 DIAGNOSIS — F3181 Bipolar II disorder: Secondary | ICD-10-CM | POA: Diagnosis not present

## 2020-04-27 ENCOUNTER — Ambulatory Visit (INDEPENDENT_AMBULATORY_CARE_PROVIDER_SITE_OTHER): Payer: BC Managed Care – PPO | Admitting: Clinical

## 2020-04-27 DIAGNOSIS — F3181 Bipolar II disorder: Secondary | ICD-10-CM

## 2020-05-04 ENCOUNTER — Ambulatory Visit (INDEPENDENT_AMBULATORY_CARE_PROVIDER_SITE_OTHER): Payer: BC Managed Care – PPO | Admitting: Clinical

## 2020-05-04 DIAGNOSIS — F3181 Bipolar II disorder: Secondary | ICD-10-CM | POA: Diagnosis not present

## 2020-05-11 ENCOUNTER — Ambulatory Visit (INDEPENDENT_AMBULATORY_CARE_PROVIDER_SITE_OTHER): Payer: BC Managed Care – PPO | Admitting: Clinical

## 2020-05-11 DIAGNOSIS — F3181 Bipolar II disorder: Secondary | ICD-10-CM

## 2020-05-18 ENCOUNTER — Ambulatory Visit (INDEPENDENT_AMBULATORY_CARE_PROVIDER_SITE_OTHER): Payer: BC Managed Care – PPO | Admitting: Clinical

## 2020-05-18 DIAGNOSIS — F3181 Bipolar II disorder: Secondary | ICD-10-CM

## 2020-06-08 ENCOUNTER — Ambulatory Visit (INDEPENDENT_AMBULATORY_CARE_PROVIDER_SITE_OTHER): Payer: BC Managed Care – PPO | Admitting: Clinical

## 2020-06-08 DIAGNOSIS — F3181 Bipolar II disorder: Secondary | ICD-10-CM | POA: Diagnosis not present

## 2020-06-13 ENCOUNTER — Encounter: Payer: Self-pay | Admitting: Family Medicine

## 2020-06-15 ENCOUNTER — Ambulatory Visit (INDEPENDENT_AMBULATORY_CARE_PROVIDER_SITE_OTHER): Payer: BC Managed Care – PPO | Admitting: Clinical

## 2020-06-15 ENCOUNTER — Other Ambulatory Visit: Payer: Self-pay

## 2020-06-15 DIAGNOSIS — F3181 Bipolar II disorder: Secondary | ICD-10-CM | POA: Diagnosis not present

## 2020-06-15 DIAGNOSIS — Z124 Encounter for screening for malignant neoplasm of cervix: Secondary | ICD-10-CM

## 2020-06-22 ENCOUNTER — Ambulatory Visit (INDEPENDENT_AMBULATORY_CARE_PROVIDER_SITE_OTHER): Payer: BC Managed Care – PPO | Admitting: Clinical

## 2020-06-22 DIAGNOSIS — F3181 Bipolar II disorder: Secondary | ICD-10-CM

## 2020-06-29 ENCOUNTER — Ambulatory Visit (INDEPENDENT_AMBULATORY_CARE_PROVIDER_SITE_OTHER): Payer: BC Managed Care – PPO | Admitting: Clinical

## 2020-06-29 DIAGNOSIS — F3181 Bipolar II disorder: Secondary | ICD-10-CM | POA: Diagnosis not present

## 2020-07-15 ENCOUNTER — Ambulatory Visit (INDEPENDENT_AMBULATORY_CARE_PROVIDER_SITE_OTHER): Payer: BC Managed Care – PPO | Admitting: Clinical

## 2020-07-15 DIAGNOSIS — F3181 Bipolar II disorder: Secondary | ICD-10-CM | POA: Diagnosis not present

## 2020-07-22 ENCOUNTER — Ambulatory Visit (INDEPENDENT_AMBULATORY_CARE_PROVIDER_SITE_OTHER): Payer: BC Managed Care – PPO | Admitting: Clinical

## 2020-07-22 DIAGNOSIS — F3181 Bipolar II disorder: Secondary | ICD-10-CM | POA: Diagnosis not present

## 2020-07-29 ENCOUNTER — Ambulatory Visit (INDEPENDENT_AMBULATORY_CARE_PROVIDER_SITE_OTHER): Payer: BC Managed Care – PPO | Admitting: Clinical

## 2020-07-29 DIAGNOSIS — F3181 Bipolar II disorder: Secondary | ICD-10-CM

## 2020-08-05 ENCOUNTER — Ambulatory Visit: Payer: BC Managed Care – PPO | Admitting: Clinical

## 2020-08-12 ENCOUNTER — Ambulatory Visit (INDEPENDENT_AMBULATORY_CARE_PROVIDER_SITE_OTHER): Payer: BC Managed Care – PPO | Admitting: Clinical

## 2020-08-12 DIAGNOSIS — F3181 Bipolar II disorder: Secondary | ICD-10-CM | POA: Diagnosis not present

## 2020-08-26 ENCOUNTER — Ambulatory Visit (INDEPENDENT_AMBULATORY_CARE_PROVIDER_SITE_OTHER): Payer: BC Managed Care – PPO | Admitting: Clinical

## 2020-08-26 DIAGNOSIS — F3181 Bipolar II disorder: Secondary | ICD-10-CM

## 2020-09-02 ENCOUNTER — Ambulatory Visit (INDEPENDENT_AMBULATORY_CARE_PROVIDER_SITE_OTHER): Payer: BC Managed Care – PPO | Admitting: Clinical

## 2020-09-02 DIAGNOSIS — F3181 Bipolar II disorder: Secondary | ICD-10-CM | POA: Diagnosis not present

## 2020-09-09 ENCOUNTER — Ambulatory Visit: Payer: BC Managed Care – PPO | Admitting: Clinical

## 2020-09-16 ENCOUNTER — Ambulatory Visit (INDEPENDENT_AMBULATORY_CARE_PROVIDER_SITE_OTHER): Payer: BC Managed Care – PPO | Admitting: Clinical

## 2020-09-16 DIAGNOSIS — F3181 Bipolar II disorder: Secondary | ICD-10-CM | POA: Diagnosis not present

## 2020-09-23 ENCOUNTER — Ambulatory Visit (INDEPENDENT_AMBULATORY_CARE_PROVIDER_SITE_OTHER): Payer: BC Managed Care – PPO | Admitting: Clinical

## 2020-09-23 DIAGNOSIS — F3181 Bipolar II disorder: Secondary | ICD-10-CM | POA: Diagnosis not present

## 2020-09-27 ENCOUNTER — Ambulatory Visit (INDEPENDENT_AMBULATORY_CARE_PROVIDER_SITE_OTHER): Payer: BC Managed Care – PPO | Admitting: Clinical

## 2020-09-27 DIAGNOSIS — F3181 Bipolar II disorder: Secondary | ICD-10-CM

## 2020-09-30 ENCOUNTER — Ambulatory Visit: Payer: BC Managed Care – PPO | Admitting: Clinical

## 2020-10-07 ENCOUNTER — Ambulatory Visit (INDEPENDENT_AMBULATORY_CARE_PROVIDER_SITE_OTHER): Payer: BC Managed Care – PPO | Admitting: Clinical

## 2020-10-07 DIAGNOSIS — F3181 Bipolar II disorder: Secondary | ICD-10-CM

## 2020-10-14 ENCOUNTER — Ambulatory Visit (INDEPENDENT_AMBULATORY_CARE_PROVIDER_SITE_OTHER): Payer: BC Managed Care – PPO | Admitting: Clinical

## 2020-10-14 DIAGNOSIS — F3181 Bipolar II disorder: Secondary | ICD-10-CM | POA: Diagnosis not present

## 2020-10-21 ENCOUNTER — Ambulatory Visit (INDEPENDENT_AMBULATORY_CARE_PROVIDER_SITE_OTHER): Payer: BC Managed Care – PPO | Admitting: Clinical

## 2020-10-21 DIAGNOSIS — F3181 Bipolar II disorder: Secondary | ICD-10-CM

## 2020-10-25 ENCOUNTER — Ambulatory Visit: Payer: BC Managed Care – PPO | Admitting: Clinical

## 2020-10-28 ENCOUNTER — Ambulatory Visit: Payer: BC Managed Care – PPO | Admitting: Clinical

## 2020-11-04 ENCOUNTER — Ambulatory Visit: Payer: BC Managed Care – PPO | Admitting: Clinical

## 2020-11-05 IMAGING — DX DG CHEST 2V
2 series · 2 of 2 positions shown · non-contrast
Comparison: None.

CLINICAL DATA: Short of breath

EXAM:
CHEST - 2 VIEW

[chest pa]
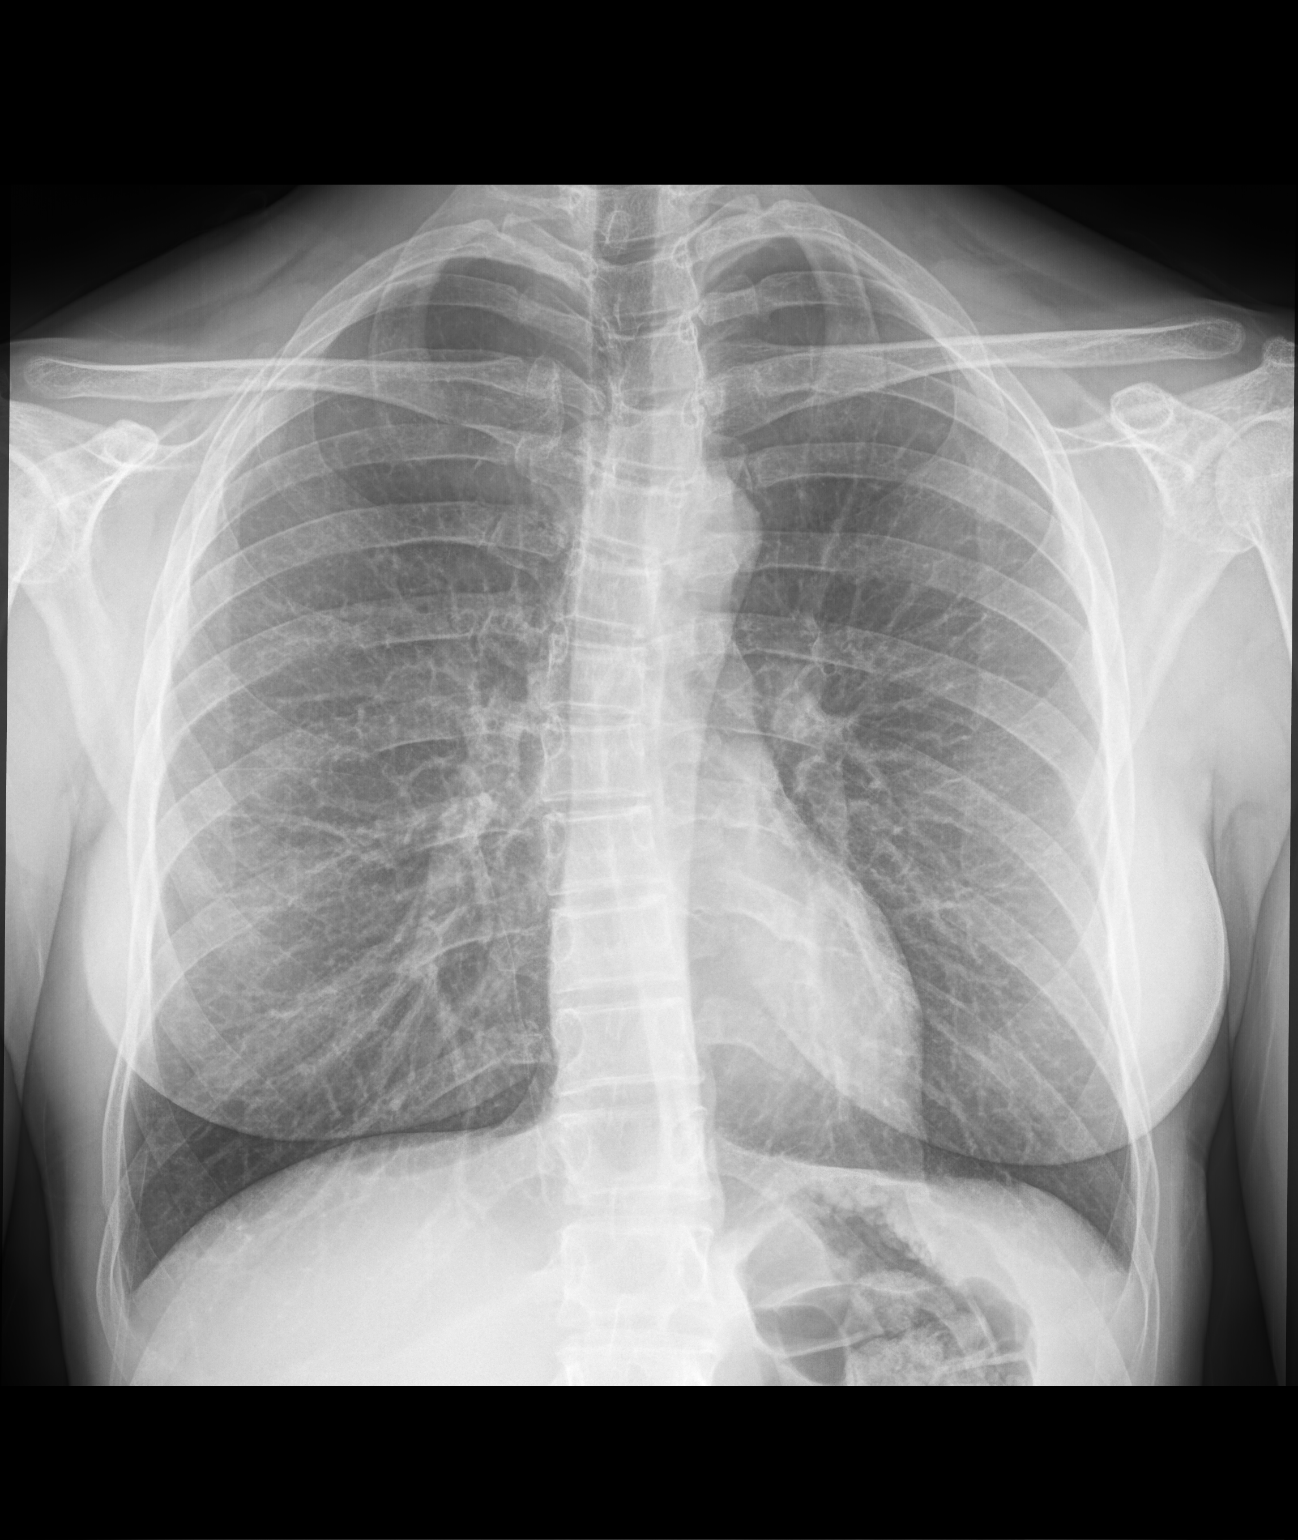

[chest lat]
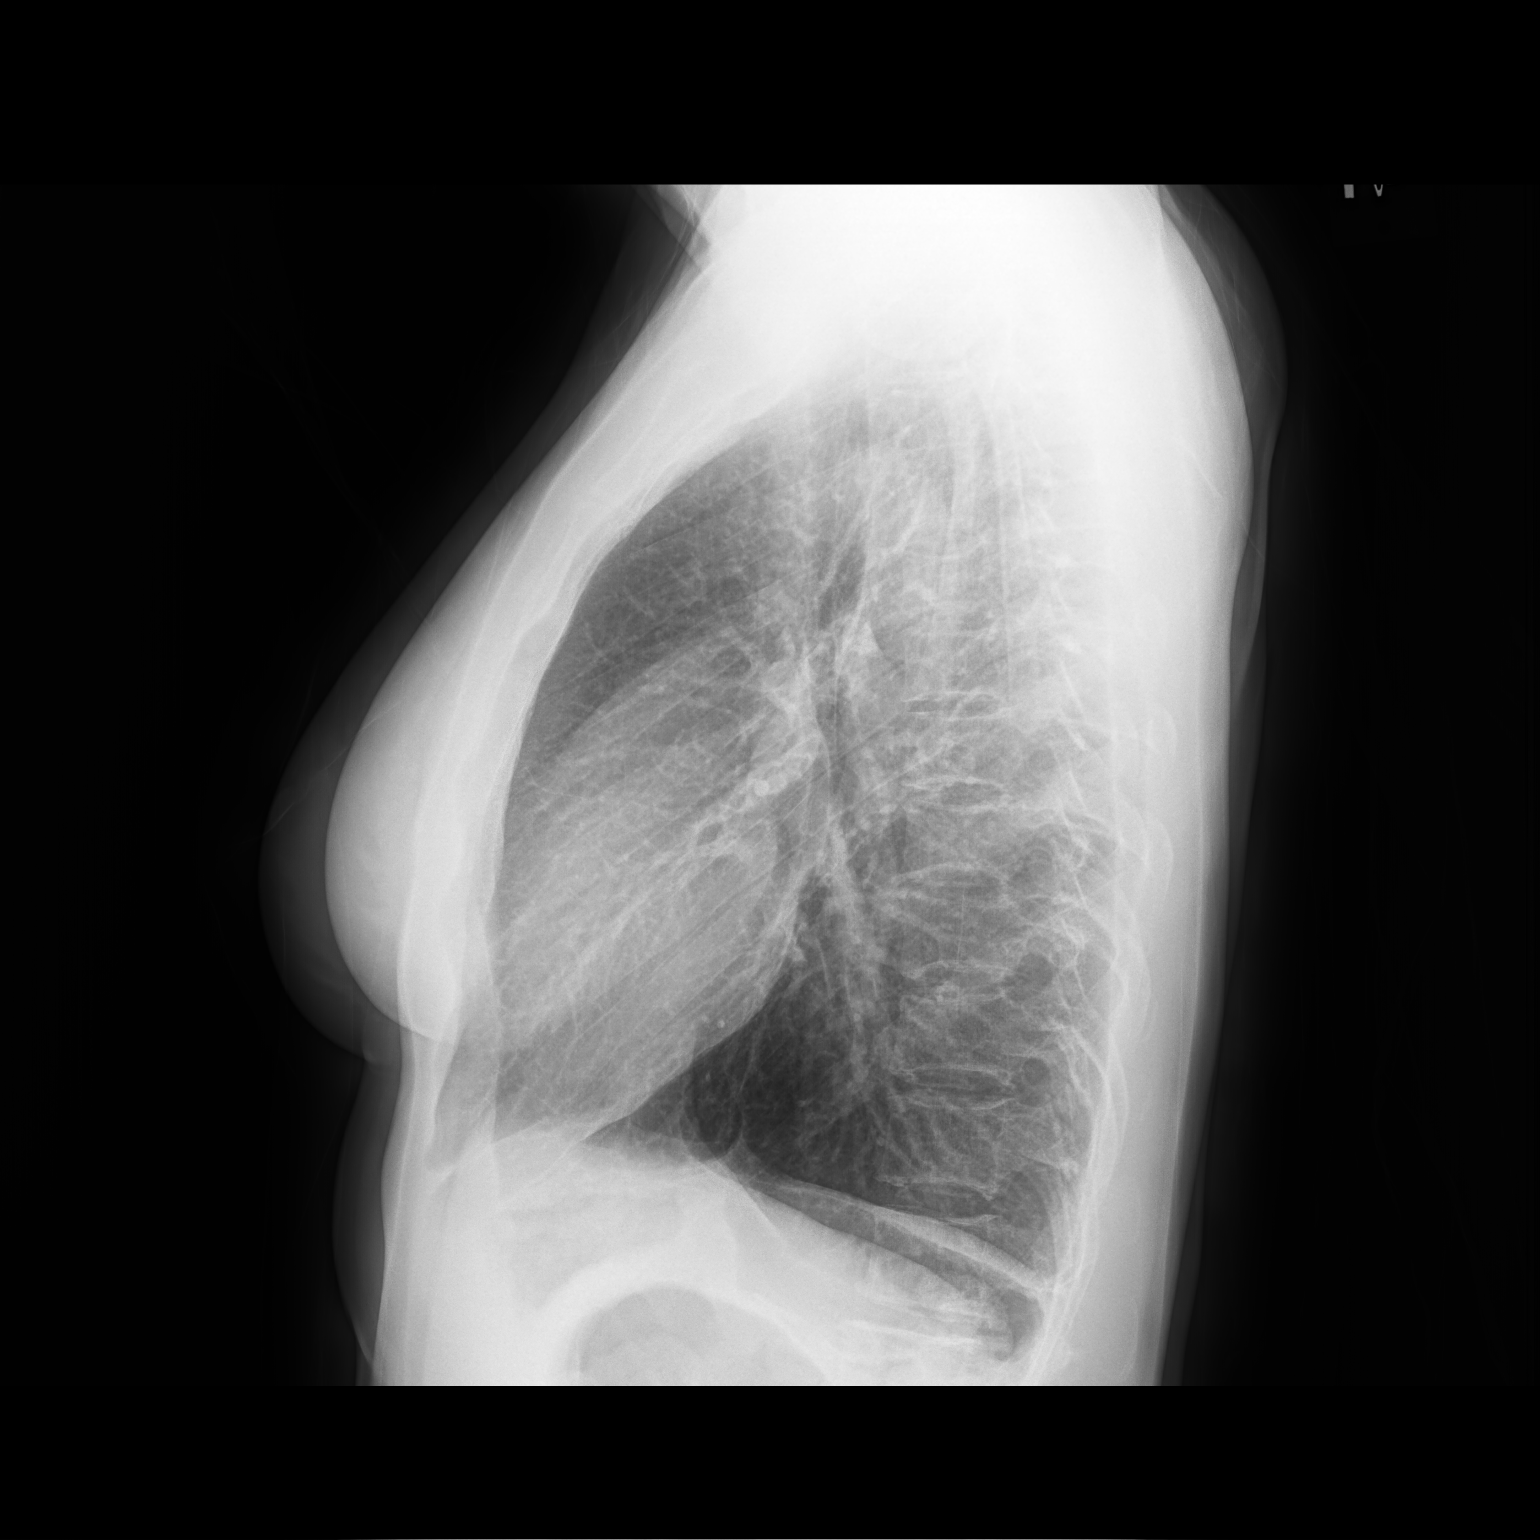

[2 of 2 positions shown; findings below may reference images not displayed]

FINDINGS: No focal opacity or pleural effusion. Slightly coarse interstitial
opacities in the perihilar regions. Normal heart size. No
pneumothorax. Mild scoliosis of the thoracic spine
IMPRESSION: No active cardiopulmonary disease. Perihilar interstitial opacity,
question reactive airways disease.

## 2020-11-11 ENCOUNTER — Ambulatory Visit: Payer: BC Managed Care – PPO | Admitting: Clinical

## 2020-11-18 ENCOUNTER — Ambulatory Visit: Payer: BC Managed Care – PPO | Admitting: Clinical

## 2020-11-25 ENCOUNTER — Ambulatory Visit: Payer: BC Managed Care – PPO | Admitting: Clinical

## 2020-12-02 ENCOUNTER — Ambulatory Visit: Payer: BC Managed Care – PPO | Admitting: Clinical

## 2020-12-09 ENCOUNTER — Ambulatory Visit: Payer: BC Managed Care – PPO | Admitting: Clinical

## 2020-12-16 ENCOUNTER — Ambulatory Visit: Payer: BC Managed Care – PPO | Admitting: Clinical

## 2020-12-23 ENCOUNTER — Ambulatory Visit: Payer: BC Managed Care – PPO | Admitting: Clinical

## 2020-12-30 ENCOUNTER — Ambulatory Visit: Payer: BC Managed Care – PPO | Admitting: Clinical

## 2021-01-06 ENCOUNTER — Ambulatory Visit: Payer: BC Managed Care – PPO | Admitting: Clinical

## 2021-01-06 DIAGNOSIS — S63619A Unspecified sprain of unspecified finger, initial encounter: Secondary | ICD-10-CM | POA: Insufficient documentation

## 2021-01-13 ENCOUNTER — Ambulatory Visit: Payer: BC Managed Care – PPO | Admitting: Clinical

## 2021-01-20 ENCOUNTER — Ambulatory Visit: Payer: BC Managed Care – PPO | Admitting: Clinical

## 2021-01-25 ENCOUNTER — Encounter: Payer: Self-pay | Admitting: *Deleted

## 2021-06-09 ENCOUNTER — Encounter: Payer: BC Managed Care – PPO | Admitting: Family Medicine

## 2021-06-14 ENCOUNTER — Other Ambulatory Visit: Payer: Self-pay

## 2021-06-14 ENCOUNTER — Encounter: Payer: Self-pay | Admitting: Family Medicine

## 2021-06-14 ENCOUNTER — Ambulatory Visit (INDEPENDENT_AMBULATORY_CARE_PROVIDER_SITE_OTHER): Payer: BC Managed Care – PPO | Admitting: Family Medicine

## 2021-06-14 VITALS — BP 112/71 | HR 56 | Temp 98.2°F | Ht 62.0 in | Wt 122.2 lb

## 2021-06-14 DIAGNOSIS — Z13220 Encounter for screening for lipoid disorders: Secondary | ICD-10-CM | POA: Diagnosis not present

## 2021-06-14 DIAGNOSIS — L309 Dermatitis, unspecified: Secondary | ICD-10-CM

## 2021-06-14 DIAGNOSIS — F4310 Post-traumatic stress disorder, unspecified: Secondary | ICD-10-CM | POA: Diagnosis not present

## 2021-06-14 DIAGNOSIS — E559 Vitamin D deficiency, unspecified: Secondary | ICD-10-CM

## 2021-06-14 DIAGNOSIS — E538 Deficiency of other specified B group vitamins: Secondary | ICD-10-CM

## 2021-06-14 DIAGNOSIS — M79646 Pain in unspecified finger(s): Secondary | ICD-10-CM | POA: Diagnosis not present

## 2021-06-14 DIAGNOSIS — Z0001 Encounter for general adult medical examination with abnormal findings: Secondary | ICD-10-CM

## 2021-06-14 DIAGNOSIS — F32A Depression, unspecified: Secondary | ICD-10-CM

## 2021-06-14 DIAGNOSIS — F314 Bipolar disorder, current episode depressed, severe, without psychotic features: Secondary | ICD-10-CM

## 2021-06-14 LAB — COMPREHENSIVE METABOLIC PANEL
ALT: 16 U/L (ref 0–35)
AST: 25 U/L (ref 0–37)
Albumin: 4.5 g/dL (ref 3.5–5.2)
Alkaline Phosphatase: 56 U/L (ref 39–117)
BUN: 10 mg/dL (ref 6–23)
CO2: 28 mEq/L (ref 19–32)
Calcium: 10 mg/dL (ref 8.4–10.5)
Chloride: 100 mEq/L (ref 96–112)
Creatinine, Ser: 0.94 mg/dL (ref 0.40–1.20)
GFR: 78.38 mL/min (ref >60.00)
Glucose, Bld: 85 mg/dL (ref 70–99)
Potassium: 4.8 mEq/L (ref 3.5–5.1)
Sodium: 137 mEq/L (ref 135–145)
Total Bilirubin: 0.7 mg/dL (ref 0.2–1.2)
Total Protein: 6.7 g/dL (ref 6.0–8.3)

## 2021-06-14 LAB — LIPID PANEL
Cholesterol: 175 mg/dL (ref 0–200)
HDL: 82.7 mg/dL (ref >39.00)
LDL Cholesterol: 85 mg/dL (ref 0–99)
NonHDL: 92.5
Total CHOL/HDL Ratio: 2
Triglycerides: 37 mg/dL (ref 0.0–149.0)
VLDL: 7.4 mg/dL (ref 0.0–40.0)

## 2021-06-14 LAB — TSH: TSH: 2.22 u[IU]/mL (ref 0.35–5.50)

## 2021-06-14 LAB — VITAMIN D 25 HYDROXY (VIT D DEFICIENCY, FRACTURES): VITD: 46.01 ng/mL (ref 30.00–100.00)

## 2021-06-14 LAB — CBC
HCT: 42.1 % (ref 36.0–46.0)
Hemoglobin: 14 g/dL (ref 12.0–15.0)
MCHC: 33.3 g/dL (ref 30.0–36.0)
MCV: 87.5 fl (ref 78.0–100.0)
Platelets: 310 K/uL (ref 150.0–400.0)
RBC: 4.81 Mil/uL (ref 3.87–5.11)
RDW: 13.7 % (ref 11.5–15.5)
WBC: 5.9 K/uL (ref 4.0–10.5)

## 2021-06-14 LAB — VITAMIN B12: Vitamin B-12: 378 pg/mL (ref 211–911)

## 2021-06-14 NOTE — Assessment & Plan Note (Signed)
Stable.  Managed by psychiatry. 

## 2021-06-14 NOTE — Assessment & Plan Note (Signed)
Stable.  Continue management per psychiatry. 

## 2021-06-14 NOTE — Progress Notes (Signed)
Chief Complaint:  Casey Glenn is a 35 y.o. female who presents today for her annual comprehensive physical exam.    Assessment/Plan:  New/Acute Problems: Finger pain Likely sequela of recent sprain.  Recommended topical Voltaren.  She will let me know if not improving and we can refer to sports medicine.  Chronic Problems Addressed Today: Depression Stable.  Managed by psychiatry.  PTSD (post-traumatic stress disorder) Stable.  Managed by psychiatry.  Eczema Recommended topical hydrocortisone cream in addition to her topical facial lotion.  Bipolar 1 disorder, depressed, severe (HCC) Stable.  Continue management per psychiatry.   Preventative Healthcare: Check labs. UTD on vaccines and screenings.   Patient Counseling(The following topics were reviewed and/or handout was given):  -Nutrition: Stressed importance of moderation in sodium/caffeine intake, saturated fat and cholesterol, caloric balance, sufficient intake of fresh fruits, vegetables, and fiber.  -Stressed the importance of regular exercise.   -Substance Abuse: Discussed cessation/primary prevention of tobacco, alcohol, or other drug use; driving or other dangerous activities under the influence; availability of treatment for abuse.   -Injury prevention: Discussed safety belts, safety helmets, smoke detector, smoking near bedding or upholstery.   -Sexuality: Discussed sexually transmitted diseases, partner selection, use of condoms, avoidance of unintended pregnancy and contraceptive alternatives.   -Dental health: Discussed importance of regular tooth brushing, flossing, and dental visits.  -Health maintenance and immunizations reviewed. Please refer to Health maintenance section.  Return to care in 1 year for next preventative visit.     Subjective:  HPI:   She has no other acute complaints today.   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 She is concerned about the  swelling and she is interesting to try anti inflammation.  In addition to this, she complains of eczema which started about 2 years ago. She used a cream before but it did not improve the symptoms.She is willing to try cortisone cream for eczema. She have a hx of bipolar and currently goes to therapist for it.   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   Depression screen PHQ 2/9 06/14/2021  Decreased Interest 0  Down, Depressed, Hopeless 0  PHQ - 2 Score 0   Health Maintenance Due  Topic Date Due   Hepatitis C Screening  Never done   INFLUENZA VACCINE  06/05/2021    ROS: Per HPI, otherwise a complete review of systems was negative.   PMH:  The following were reviewed and entered/updated in epic: Past Medical History:  Diagnosis Date   Amenorrhea following discontinuation of oral contraceptive use    none since 2013   Bipolar 1 disorder (HCC)    Bipolar 1 disorder (HCC)    Suicide attempt [facility])    Patient Active Problem List   Diagnosis Date Noted   Eczema 06/14/2021   PTSD (post-traumatic stress disorder) 06/14/2021   Bipolar 1 disorder, depressed, severe (HCC) 09/30/2017   PCOS (polycystic ovarian syndrome) 02/23/2014   Amenorrhea 11/28/2012   Depression 11/28/2012   Past Surgical History:  Procedure Laterality Date   WISDOM TOOTH EXTRACTION      Family History  Problem Relation Age of Onset   Mood Disorder Father    Hyperlipidemia Maternal Grandmother    Hyperlipidemia Paternal Grandmother    Mood Disorder Paternal Grandfather     Medications- reviewed and updated Current Outpatient Medications  Medication Sig Dispense Refill   buPROPion (WELLBUTRIN XL) 300 MG 24 hr tablet Take 1  tablet (300 mg total) by mouth daily. For mood ocntrol 30 tablet 0   lamoTRIgine (LAMICTAL) 25 MG tablet Take 125 mg by mouth daily.     LORazepam (ATIVAN) 1 MG tablet Take 1 mg by mouth every 8 (eight) hours.     PARAGARD INTRAUTERINE  COPPER IU by Intrauterine route.     No current facility-administered medications for this visit.    Allergies-reviewed and updated No Known Allergies  Social History   Socioeconomic History   Marital status: Married    Spouse name: Not on file   Number of children: Not on file   Years of education: Not on file   Highest education level: Not on file  Occupational History   Not on file  Tobacco Use   Smoking status: Never   Smokeless tobacco: Never  Substance and Sexual Activity   Alcohol use: No    Comment: Rarely   Drug use: No   Sexual activity: Yes    Birth control/protection: I.U.D.  Other Topics Concern   Not on file  Social History Narrative   Not on file   Social Determinants of Health   Financial Resource Strain: Not on file  Food Insecurity: Not on file  Transportation Needs: Not on file  Physical Activity: Not on file  Stress: Not on file  Social Connections: Not on file        Objective:  Physical Exam: BP 112/71   Pulse (!) 56   Temp 98.2 F (36.8 C) (Temporal)   Ht 5\' 2"  (1.575 m)   Wt 122 lb 3.2 oz (55.4 kg)   LMP 06/08/2021   SpO2 100%   BMI 22.35 kg/m   Body mass index is 22.35 kg/m. Wt Readings from Last 3 Encounters:  06/14/21 122 lb 3.2 oz (55.4 kg)  04/12/20 116 lb 4 oz (52.7 kg)  09/18/19 116 lb (52.6 kg)   Gen: NAD, resting comfortably HEENT: TMs normal bilaterally. OP clear. No thyromegaly noted.  CV: RRR with no murmurs appreciated Pulm: NWOB, CTAB with no crackles, wheezes, or rhonchi GI: Normal bowel sounds present. Soft, Nontender, Nondistended. MSK: no edema, cyanosis, or clubbing noted. Slight swelling to left fourth PIP joint Skin: warm, dry.  Perioral eczema noted. Neuro: CN2-12 grossly intact. Strength 5/5 in upper and lower extremities. Reflexes symmetric and intact bilaterally.  Psych: Normal affect and thought content      I,Savera Zaman,acting as a scribe for 09/20/19, MD.,have documented all relevant  documentation on the behalf of Casey Doe, MD,as directed by  Casey Doe, MD while in the presence of Casey Doe, MD.  I, Casey Doe, MD, have reviewed all documentation for this visit. The documentation on 06/14/21 for the exam, diagnosis, procedures, and orders are all accurate and complete.  08/14/21. Katina Degree, MD 06/14/2021 8:38 AM

## 2021-06-14 NOTE — Assessment & Plan Note (Signed)
Recommended topical hydrocortisone cream in addition to her topical facial lotion.

## 2021-06-14 NOTE — Patient Instructions (Signed)
It was very nice to see you today!  We will check blood work today.  Please try using a bit of cortisone cream for your eczema.  He can try using Voltaren gel for your finger.  I will see you back in year.  Please come back to see me sooner if needed.  Take care, Dr Jerline Pain  PLEASE NOTE:  If you had any lab tests please let us know if you have not heard back within a few days. You may see your results on mychart before we have a chance to review them but we will give you a call once they are reviewed by [place]. If we ordered any referrals today, please let us know if you have not heard from their office within the next week.   Please try these tips to maintain a healthy lifestyle:  Eat at least 3 REAL meals and 1-2 snacks per day.  Aim for no more than 5 hours between eating.  If you eat breakfast, please do so within one hour of getting up.   Each meal should contain half fruits/vegetables, one quarter protein, and one quarter carbs (no bigger than a computer mouse)  Cut down on sweet beverages. This includes juice, soda, and sweet tea.   Drink at least 1 glass of water with each meal and aim for at least 8 glasses per day  Exercise at least 150 minutes every week.    Preventive Care 23-49 Years Old, Female Preventive care refers to lifestyle choices and visits with your health care provider that can promote health and wellness. This includes: A yearly physical exam. This is also called an annual wellness visit. Regular dental and eye exams. Immunizations. Screening for certain conditions. Healthy lifestyle choices, such as: Eating a healthy diet. Getting regular exercise. Not using drugs or products that contain nicotine and tobacco. Limiting alcohol use. What can I expect for my preventive care visit? Physical exam Your health care provider may check your: Height and weight. These may be used to calculate your BMI (body mass index). BMI is a measurement that tells if you are  at a healthy weight. Heart rate and blood pressure. Body temperature. Skin for abnormal spots. Counseling Your health care provider may ask you questions about your: Past medical problems. Family's medical history. Alcohol, tobacco, and drug use. Emotional well-being. Home life and relationship well-being. Sexual activity. Diet, exercise, and sleep habits. Work and work Statistician. Access to firearms. Method of birth control. Menstrual cycle. Pregnancy history. What immunizations do I need?  Vaccines are usually given at various ages, according to a schedule. Your health care provider will recommend vaccines for you based on your age, medicalhistory, and lifestyle or other factors, such as travel or where you work. What tests do I need?  Blood tests Lipid and cholesterol levels. These may be checked every 5 years starting at age 41. Hepatitis C test. Hepatitis B test. Screening Diabetes screening. This is done by checking your blood sugar (glucose) after you have not eaten for a while (fasting). STD (sexually transmitted disease) testing, if you are at risk. BRCA-related cancer screening. This may be done if you have a family history of breast, ovarian, tubal, or peritoneal cancers. Pelvic exam and Pap test. This may be done every 3 years starting at age 30. Starting at age 50, this may be done every 5 years if you have a Pap test in combination with an HPV test. Talk with your health care provider about your test  results, treatment options,and if necessary, the need for more tests. Follow these instructions at home: Eating and drinking  Eat a healthy diet that includes fresh fruits and vegetables, whole grains, lean protein, and low-fat dairy products. Take vitamin and mineral supplements as recommended by your health care provider. Do not drink alcohol if: Your health care provider tells you not to drink. You are pregnant, may be pregnant, or are planning to become  pregnant. If you drink alcohol: Limit how much you have to 0-1 drink a day. Be aware of how much alcohol is in your drink. In the [place] one drink equals one 12 oz bottle of beer (355 mL), one 5 oz glass of wine (148 mL), or one 1 oz glass of hard liquor (44 mL).  Lifestyle Take daily care of your teeth and gums. Brush your teeth every morning and night with fluoride toothpaste. Floss one time each day. Stay active. Exercise for at least 30 minutes 5 or more days each week. Do not use any products that contain nicotine or tobacco, such as cigarettes, e-cigarettes, and chewing tobacco. If you need help quitting, ask your health care provider. Do not use drugs. If you are sexually active, practice safe sex. Use a condom or other form of protection to prevent STIs (sexually transmitted infections). If you do not wish to become pregnant, use a form of birth control. If you plan to become pregnant, see your health care provider for a prepregnancy visit. Find healthy ways to cope with stress, such as: Meditation, yoga, or listening to music. Journaling. Talking to a trusted person. Spending time with friends and family. Safety Always wear your seat belt while driving or riding in a vehicle. Do not drive: If you have been drinking alcohol. Do not ride with someone who has been drinking. When you are tired or distracted. While texting. Wear a helmet and other protective equipment during sports activities. If you have firearms in your house, make sure you follow all gun safety procedures. Seek help if you have been physically or sexually abused. What's next? Go to your health care provider once a year for an annual wellness visit. Ask your health care provider how often you should have your eyes and teeth checked. Stay up to date on all vaccines. This information is not intended to replace advice given to you by your health care provider. Make sure you discuss any questions you have with your  healthcare provider. Document Revised: 06/19/2020 Document Reviewed: 07/03/2018 Elsevier Patient Education  2022 [employer].

## 2021-06-20 NOTE — Progress Notes (Signed)
Please inform patient of the following:  Blood work is all normal.  Would like her to continue working on diet and exercise and we can recheck in a year.

## 2022-06-15 ENCOUNTER — Encounter: Payer: BC Managed Care – PPO | Admitting: Family Medicine

## 2022-06-19 ENCOUNTER — Encounter: Payer: Self-pay | Admitting: Family Medicine

## 2022-06-19 ENCOUNTER — Ambulatory Visit (INDEPENDENT_AMBULATORY_CARE_PROVIDER_SITE_OTHER): Payer: BC Managed Care – PPO | Admitting: Family Medicine

## 2022-06-19 VITALS — BP 118/70 | HR 83 | Temp 98.2°F | Ht 62.0 in | Wt 119.6 lb

## 2022-06-19 DIAGNOSIS — R739 Hyperglycemia, unspecified: Secondary | ICD-10-CM

## 2022-06-19 DIAGNOSIS — Z13220 Encounter for screening for lipoid disorders: Secondary | ICD-10-CM

## 2022-06-19 DIAGNOSIS — F314 Bipolar disorder, current episode depressed, severe, without psychotic features: Secondary | ICD-10-CM | POA: Diagnosis not present

## 2022-06-19 DIAGNOSIS — E559 Vitamin D deficiency, unspecified: Secondary | ICD-10-CM

## 2022-06-19 DIAGNOSIS — Z0001 Encounter for general adult medical examination with abnormal findings: Secondary | ICD-10-CM | POA: Diagnosis not present

## 2022-06-19 DIAGNOSIS — Z52008 Unspecified donor, other blood: Secondary | ICD-10-CM | POA: Diagnosis not present

## 2022-06-19 DIAGNOSIS — E538 Deficiency of other specified B group vitamins: Secondary | ICD-10-CM

## 2022-06-19 DIAGNOSIS — Z0000 Encounter for general adult medical examination without abnormal findings: Secondary | ICD-10-CM

## 2022-06-19 DIAGNOSIS — F32A Depression, unspecified: Secondary | ICD-10-CM

## 2022-06-19 NOTE — Assessment & Plan Note (Signed)
Follows with psychiatry and therapy.  Symptoms are stable.  She is currently on Lamictal 150 mg daily, Wellbutrin 300 mg daily, and Ativan 1 mg every 8 hours as needed.

## 2022-06-19 NOTE — Assessment & Plan Note (Signed)
Check CBC.  Recent platelet counts have been in the 300s to 400s.  Did have one elevated into the 500s several months ago.  It is okay for her to continue with platelet donation.

## 2022-06-19 NOTE — Patient Instructions (Signed)
It was very nice to see you today!  We will check blood work today.  Keep up the good work with your diet and exercise.  It is okay for you to continue donating platelets.  I will see back in year for your next physical.  Come back sooner if needed.  Take care, Dr Jerline Pain  PLEASE NOTE:  If you had any lab tests please let us know if you have not heard back within a few days. You may see your results on mychart before we have a chance to review them but we will give you a call once they are reviewed by [place]. If we ordered any referrals today, please let us know if you have not heard from their office within the next week.   Please try these tips to maintain a healthy lifestyle:  Eat at least 3 REAL meals and 1-2 snacks per day.  Aim for no more than 5 hours between eating.  If you eat breakfast, please do so within one hour of getting up.   Each meal should contain half fruits/vegetables, one quarter protein, and one quarter carbs (no bigger than a computer mouse)  Cut down on sweet beverages. This includes juice, soda, and sweet tea.   Drink at least 1 glass of water with each meal and aim for at least 8 glasses per day  Exercise at least 150 minutes every week.    Preventive Care 37-37 Years Old, Female Preventive care refers to lifestyle choices and visits with your health care provider that can promote health and wellness. Preventive care visits are also called wellness exams. What can I expect for my preventive care visit? Counseling During your preventive care visit, your health care provider may ask about your: Medical history, including: Past medical problems. Family medical history. Pregnancy history. Current health, including: Menstrual cycle. Method of birth control. Emotional well-being. Home life and relationship well-being. Sexual activity and sexual health. Lifestyle, including: Alcohol, nicotine or tobacco, and drug use. Access to firearms. Diet, exercise, and  sleep habits. Work and work Statistician. Sunscreen use. Safety issues such as seatbelt and bike helmet use. Physical exam Your health care provider may check your: Height and weight. These may be used to calculate your BMI (body mass index). BMI is a measurement that tells if you are at a healthy weight. Waist circumference. This measures the distance around your waistline. This measurement also tells if you are at a healthy weight and may help predict your risk of certain diseases, such as type 2 diabetes and high blood pressure. Heart rate and blood pressure. Body temperature. Skin for abnormal spots. What immunizations do I need?  Vaccines are usually given at various ages, according to a schedule. Your health care provider will recommend vaccines for you based on your age, medical history, and lifestyle or other factors, such as travel or where you work. What tests do I need? Screening Your health care provider may recommend screening tests for certain conditions. This may include: Pelvic exam and Pap test. Lipid and cholesterol levels. Diabetes screening. This is done by checking your blood sugar (glucose) after you have not eaten for a while (fasting). Hepatitis B test. Hepatitis C test. HIV (human immunodeficiency virus) test. STI (sexually transmitted infection) testing, if you are at risk. BRCA-related cancer screening. This may be done if you have a family history of breast, ovarian, tubal, or peritoneal cancers. Talk with your health care provider about your test results, treatment options, and if necessary,  the need for more tests. Follow these instructions at home: Eating and drinking  Eat a healthy diet that includes fresh fruits and vegetables, whole grains, lean protein, and low-fat dairy products. Take vitamin and mineral supplements as recommended by your health care provider. Do not drink alcohol if: Your health care provider tells you not to drink. You are  pregnant, may be pregnant, or are planning to become pregnant. If you drink alcohol: Limit how much you have to 0-1 drink a day. Know how much alcohol is in your drink. In the [place] one drink equals one 12 oz bottle of beer (355 mL), one 5 oz glass of wine (148 mL), or one 1 oz glass of hard liquor (44 mL). Lifestyle Brush your teeth every morning and night with fluoride toothpaste. Floss one time each day. Exercise for at least 30 minutes 5 or more days each week. Do not use any products that contain nicotine or tobacco. These products include cigarettes, chewing tobacco, and vaping devices, such as e-cigarettes. If you need help quitting, ask your health care provider. Do not use drugs. If you are sexually active, practice safe sex. Use a condom or other form of protection to prevent STIs. If you do not wish to become pregnant, use a form of birth control. If you plan to become pregnant, see your health care provider for a prepregnancy visit. Find healthy ways to manage stress, such as: Meditation, yoga, or listening to music. Journaling. Talking to a trusted person. Spending time with friends and family. Minimize exposure to UV radiation to reduce your risk of skin cancer. Safety Always wear your seat belt while driving or riding in a vehicle. Do not drive: If you have been drinking alcohol. Do not ride with someone who has been drinking. If you have been using any mind-altering substances or drugs. While texting. When you are tired or distracted. Wear a helmet and other protective equipment during sports activities. If you have firearms in your house, make sure you follow all gun safety procedures. Seek help if you have been physically or sexually abused. What's next? Go to your health care provider once a year for an annual wellness visit. Ask your health care provider how often you should have your eyes and teeth checked. Stay up to date on all vaccines. This information is not  intended to replace advice given to you by your health care provider. Make sure you discuss any questions you have with your health care provider. Document Revised: 04/19/2021 Document Reviewed: 04/19/2021 Elsevier Patient Education  [employer].

## 2022-06-19 NOTE — Assessment & Plan Note (Signed)
Follows with psychiatry and therapy.  Symptoms are stable.

## 2022-06-19 NOTE — Progress Notes (Signed)
Chief Complaint:  Casey Glenn is a 36 y.o. female who presents today for her annual comprehensive physical exam.    Assessment/Plan:  Chronic Problems Addressed Today: Platelet donor Check CBC.  Recent platelet counts have been in the 300s to 400s.  Did have one elevated into the 500s several months ago.  It is okay for her to continue with platelet donation.  Depression Follows with psychiatry and therapy.  Symptoms are stable.  Bipolar 1 disorder, depressed, severe (HCC) Follows with psychiatry and therapy.  Symptoms are stable.  She is currently on Lamictal 150 mg daily, Wellbutrin 300 mg daily, and Ativan 1 mg every 8 hours as needed.  Preventative Healthcare: Check labs.  Up-to-date on other vaccines and screenings.  Patient Counseling(The following topics were reviewed and/or handout was given):  -Nutrition: Stressed importance of moderation in sodium/caffeine intake, saturated fat and cholesterol, caloric balance, sufficient intake of fresh fruits, vegetables, and fiber.  -Stressed the importance of regular exercise.   -Substance Abuse: Discussed cessation/primary prevention of tobacco, alcohol, or other drug use; driving or other dangerous activities under the influence; availability of treatment for abuse.   -Injury prevention: Discussed safety belts, safety helmets, smoke detector, smoking near bedding or upholstery.   -Sexuality: Discussed sexually transmitted diseases, partner selection, use of condoms, avoidance of unintended pregnancy and contraceptive alternatives.   -Dental health: Discussed importance of regular tooth brushing, flossing, and dental visits.  -Health maintenance and immunizations reviewed. Please refer to Health maintenance section.  Return to care in 1 year for next preventative visit.     Subjective:  HPI:  She has no acute complaints today.   Lifestyle Diet: Balanced. Plenty of fruits and vegetables.  Exercise: [employer].       06/19/2022    1:18 PM  Depression screen PHQ 2/9  Decreased Interest 0  Down, Depressed, Hopeless 0  PHQ - 2 Score 0    Health Maintenance Due  Topic Date Due   Hepatitis C Screening  Never done     ROS: Per HPI, otherwise a complete review of systems was negative.   PMH:  The following were reviewed and entered/updated in epic: Past Medical History:  Diagnosis Date   Amenorrhea following discontinuation of oral contraceptive use    none since 2013   Bipolar 1 disorder (HCC)    Bipolar 1 disorder (HCC)    Suicide attempt [facility])    Patient Active Problem List   Diagnosis Date Noted   Platelet donor 06/19/2022   Eczema 06/14/2021   PTSD (post-traumatic stress disorder) 06/14/2021   Bipolar 1 disorder, depressed, severe (HCC) 09/30/2017   PCOS (polycystic ovarian syndrome) 02/23/2014   Amenorrhea 11/28/2012   Depression 11/28/2012   Past Surgical History:  Procedure Laterality Date   WISDOM TOOTH EXTRACTION      Family History  Problem Relation Age of Onset   Mood Disorder Father    Hyperlipidemia Maternal Grandmother    Hyperlipidemia Paternal Grandmother    Mood Disorder Paternal Grandfather     Medications- reviewed and updated Current Outpatient Medications  Medication Sig Dispense Refill   buPROPion (WELLBUTRIN XL) 300 MG 24 hr tablet Take 1 tablet (300 mg total) by mouth daily. For mood ocntrol 30 tablet 0   lamoTRIgine (LAMICTAL) 150 MG tablet Take 150 mg by mouth daily.     LORazepam (ATIVAN) 1 MG tablet Take 1 mg by mouth every 8 (eight) hours.     PARAGARD INTRAUTERINE COPPER IU by Intrauterine route.  No current facility-administered medications for this visit.    Allergies-reviewed and updated No Known Allergies  Social History   Socioeconomic History   Marital status: Married    Spouse name: Not on file   Number of children: Not on file   Years of education: Not on file   Highest education level: Not on file  Occupational  History   Not on file  Tobacco Use   Smoking status: Never   Smokeless tobacco: Never  Substance and Sexual Activity   Alcohol use: No    Comment: Rarely   Drug use: No   Sexual activity: Yes    Birth control/protection: I.U.D.  Other Topics Concern   Not on file  Social History Narrative   Not on file   Social Determinants of Health   Financial Resource Strain: Not on file  Food Insecurity: Not on file  Transportation Needs: Not on file  Physical Activity: Not on file  Stress: Not on file  Social Connections: Not on file        Objective:  Physical Exam: BP 118/70   Pulse 83   Temp 98.2 F (36.8 C)   Ht 5\' 2"  (1.575 m)   Wt 119 lb 9.6 oz (54.3 kg)   LMP 05/07/2022 (Exact Date)   SpO2 94%   BMI 21.88 kg/m   Body mass index is 21.88 kg/m. Wt Readings from Last 3 Encounters:  06/19/22 119 lb 9.6 oz (54.3 kg)  06/14/21 122 lb 3.2 oz (55.4 kg)  04/12/20 116 lb 4 oz (52.7 kg)   Gen: NAD, resting comfortably HEENT: TMs normal bilaterally. OP clear. No thyromegaly noted.  CV: RRR with no murmurs appreciated Pulm: NWOB, CTAB with no crackles, wheezes, or rhonchi GI: Normal bowel sounds present. Soft, Nontender, Nondistended. MSK: no edema, cyanosis, or clubbing noted Skin: warm, dry Neuro: CN2-12 grossly intact. Strength 5/5 in upper and lower extremities. Reflexes symmetric and intact bilaterally.  Psych: Normal affect and thought content     Joany Khatib M. 06/12/20, MD 06/19/2022 1:54 PM

## 2022-06-20 LAB — LIPID PANEL
Cholesterol: 164 mg/dL (ref 0–200)
HDL: 71 mg/dL (ref >39.00)
LDL Cholesterol: 82 mg/dL (ref 0–99)
NonHDL: 92.74
Total CHOL/HDL Ratio: 2
Triglycerides: 52 mg/dL (ref 0.0–149.0)
VLDL: 10.4 mg/dL (ref 0.0–40.0)

## 2022-06-20 LAB — COMPREHENSIVE METABOLIC PANEL
ALT: 16 U/L (ref 0–35)
AST: 25 U/L (ref 0–37)
Albumin: 4.4 g/dL (ref 3.5–5.2)
Alkaline Phosphatase: 57 U/L (ref 39–117)
BUN: 11 mg/dL (ref 6–23)
CO2: 28 mEq/L (ref 19–32)
Calcium: 9.6 mg/dL (ref 8.4–10.5)
Chloride: 101 mEq/L (ref 96–112)
Creatinine, Ser: 0.83 mg/dL (ref 0.40–1.20)
GFR: 90.36 mL/min (ref >60.00)
Glucose, Bld: 83 mg/dL (ref 70–99)
Potassium: 3.8 mEq/L (ref 3.5–5.1)
Sodium: 136 mEq/L (ref 135–145)
Total Bilirubin: 0.5 mg/dL (ref 0.2–1.2)
Total Protein: 6.4 g/dL (ref 6.0–8.3)

## 2022-06-20 LAB — CBC
HCT: 42.1 % (ref 36.0–46.0)
Hemoglobin: 14 g/dL (ref 12.0–15.0)
MCHC: 33.2 g/dL (ref 30.0–36.0)
MCV: 88.4 fl (ref 78.0–100.0)
Platelets: 238 10*3/uL (ref 150.0–400.0)
RBC: 4.76 Mil/uL (ref 3.87–5.11)
RDW: 13.9 % (ref 11.5–15.5)
WBC: 9.1 10*3/uL (ref 4.0–10.5)

## 2022-06-20 LAB — HEMOGLOBIN A1C: Hgb A1c MFr Bld: 5.2 % (ref 4.6–6.5)

## 2022-06-20 LAB — VITAMIN D 25 HYDROXY (VIT D DEFICIENCY, FRACTURES): VITD: 41.09 ng/mL (ref 30.00–100.00)

## 2022-06-20 LAB — VITAMIN B12: Vitamin B-12: 277 pg/mL (ref 211–911)

## 2022-06-20 LAB — TSH: TSH: 2.38 u[IU]/mL (ref 0.35–5.50)

## 2022-06-22 NOTE — Progress Notes (Signed)
Please inform patient of the following:  Great news! Labs are all NORMAL. She should keep up the good work and we can recheck in a year.

## 2023-06-20 ENCOUNTER — Encounter (INDEPENDENT_AMBULATORY_CARE_PROVIDER_SITE_OTHER): Payer: Self-pay

## 2023-06-21 ENCOUNTER — Encounter: Payer: BC Managed Care – PPO | Admitting: Family Medicine

## 2023-07-29 ENCOUNTER — Ambulatory Visit (INDEPENDENT_AMBULATORY_CARE_PROVIDER_SITE_OTHER): Payer: BC Managed Care – PPO | Admitting: Family Medicine

## 2023-07-29 ENCOUNTER — Encounter: Payer: Self-pay | Admitting: Family Medicine

## 2023-07-29 VITALS — BP 129/72 | HR 73 | Temp 97.8°F | Ht 62.0 in | Wt 128.2 lb

## 2023-07-29 DIAGNOSIS — Z13220 Encounter for screening for lipoid disorders: Secondary | ICD-10-CM

## 2023-07-29 DIAGNOSIS — R739 Hyperglycemia, unspecified: Secondary | ICD-10-CM | POA: Diagnosis not present

## 2023-07-29 DIAGNOSIS — F314 Bipolar disorder, current episode depressed, severe, without psychotic features: Secondary | ICD-10-CM

## 2023-07-29 DIAGNOSIS — E282 Polycystic ovarian syndrome: Secondary | ICD-10-CM

## 2023-07-29 DIAGNOSIS — F32A Depression, unspecified: Secondary | ICD-10-CM

## 2023-07-29 DIAGNOSIS — Z23 Encounter for immunization: Secondary | ICD-10-CM

## 2023-07-29 DIAGNOSIS — Z0000 Encounter for general adult medical examination without abnormal findings: Secondary | ICD-10-CM | POA: Diagnosis not present

## 2023-07-29 NOTE — Progress Notes (Signed)
Chief Complaint:  Casey Glenn is a 37 y.o. female who presents today for her annual comprehensive physical exam.    Assessment/Plan:  Chronic Problems Addressed Today: Depression Following with psychiatry and therapy.  Overall symptoms are stable.  She is currently on Lamictal 150 mg daily and Wellbutrin 300 mg daily.  PCOS (polycystic ovarian syndrome) Following with OB/GYN.  She has had some irregular cycles recently but this is improving.  Bipolar 1 disorder, depressed, severe (HCC) Following with psychiatry and therapist.  Overall symptoms are stable on lamictal 150 mg daily, Wellbutrin 300 mg daily and Ativan 1 mg every 8 hours as needed.  Preventative Healthcare: Check labs. Flu shot given today. UpToDate on pap per GYN.   Patient Counseling(The following topics were reviewed and/or handout was given):  -Nutrition: Stressed importance of moderation in sodium/caffeine intake, saturated fat and cholesterol, caloric balance, sufficient intake of fresh fruits, vegetables, and fiber.  -Stressed the importance of regular exercise.   -Substance Abuse: Discussed cessation/primary prevention of tobacco, alcohol, or other drug use; driving or other dangerous activities under the influence; availability of treatment for abuse.   -Injury prevention: Discussed safety belts, safety helmets, smoke detector, smoking near bedding or upholstery.   -Sexuality: Discussed sexually transmitted diseases, partner selection, use of condoms, avoidance of unintended pregnancy and contraceptive alternatives.   -Dental health: Discussed importance of regular tooth brushing, flossing, and dental visits.  -Health maintenance and immunizations reviewed. Please refer to Health maintenance section.  Return to care in 1 year for next preventative visit.     Subjective:  HPI:  She has no acute complaints today. See Assessment / plan for status of chronic conditions.   Lifestyle Diet: Balanced. Plenty of  fruits and vegetables.  Exercise: Does a lot of biking. Doing more weight lifting.      06/19/2022    1:18 PM  Depression screen PHQ 2/9  Decreased Interest 0  Down, Depressed, Hopeless 0  PHQ - 2 Score 0    Health Maintenance Due  Topic Date Due   Hepatitis C Screening  Never done     ROS: Per HPI, otherwise a complete review of systems was negative.   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   Eczema 06/14/2021   PTSD (post-traumatic stress disorder) 06/14/2021   Bipolar 1 disorder, depressed, severe (HCC) 09/30/2017   PCOS (polycystic ovarian syndrome) 02/23/2014   Amenorrhea 11/28/2012   Depression 11/28/2012   Past Surgical History:  Procedure Laterality Date   WISDOM TOOTH EXTRACTION      Family History  Problem Relation Age of Onset   Mood Disorder Father    Hyperlipidemia Maternal Grandmother    Hyperlipidemia Paternal Grandmother    Mood Disorder Paternal Grandfather     Medications- reviewed and updated Current Outpatient Medications  Medication Sig Dispense Refill   buPROPion (WELLBUTRIN XL) 300 MG 24 hr tablet Take 1 tablet (300 mg total) by mouth daily. For mood ocntrol 30 tablet 0   lamoTRIgine (LAMICTAL) 150 MG tablet Take 150 mg by mouth daily.     LORazepam (ATIVAN) 1 MG tablet Take 1 mg by mouth every 8 (eight)  hours.     PARAGARD INTRAUTERINE COPPER IU by Intrauterine route.     No current facility-administered medications for this visit.    Allergies-reviewed and updated No Known Allergies  Social History   Socioeconomic History   Marital status: Married    Spouse name: Not on file   Number of children: Not on file   Years of  education: Not on file   Highest education level: Not on file  Occupational History   Not on file  Tobacco Use   Smoking status: Never   Smokeless tobacco: Never  Substance and Sexual Activity   Alcohol use: No    Comment: Rarely   Drug use: No   Sexual activity: Yes    Birth control/protection: I.U.D.  Other Topics Concern   Not on file  Social History Narrative   Not on file   Social Determinants of Health   Financial Resource Strain: Not on file  Food Insecurity: Not on file  Transportation Needs: Not on file  Physical Activity: Not on file  Stress: Not on file  Social Connections: Not on file        Objective:  Physical Exam: BP 129/72   Pulse 73   Temp 97.8 F (36.6 C) (Temporal)   Ht 5\' 2"  (1.575 m)   Wt 128 lb 3.2 oz (58.2 kg)   LMP  (LMP Unknown)   SpO2 99%   BMI 23.45 kg/m   Body mass index is 23.45 kg/m. Wt Readings from Last 3 Encounters:  07/29/23 128 lb 3.2 oz (58.2 kg)  06/19/22 119 lb 9.6 oz (54.3 kg)  06/14/21 122 lb 3.2 oz (55.4 kg)  Gen: NAD, resting comfortably HEENT: TMs normal bilaterally. OP clear. No thyromegaly noted.  CV: RRR with no murmurs appreciated Pulm: NWOB, CTAB with no crackles, wheezes, or rhonchi GI: Normal bowel sounds present. Soft, Nontender, Nondistended. MSK: no edema, cyanosis, or clubbing noted Skin: warm, dry Neuro: CN2-12 grossly intact. Strength 5/5 in upper and lower extremities. Reflexes symmetric and intact bilaterally.  Psych: Normal affect and thought content     Michai Dieppa M. [name], MD 07/29/2023 2:39 PM

## 2023-07-29 NOTE — Patient Instructions (Addendum)
It was very nice to see you today!  We will check blood work today.  Are you your flu shot today.  Please continue to work on diet and exercise.  I will see you back in year for your next physical.  Come back sooner if needed.  Return in about 1 year (around 07/28/2024) for Annual Physical.   Take care, Dr [name]  PLEASE NOTE:  If you had any lab tests, please let us know if you have not heard back within a few days. You may see your results on mychart before we have a chance to review them but we will give you a call once they are reviewed by [place].   If we ordered any referrals today, please let us know if you have not heard from their office within the next week.   If you had any urgent prescriptions sent in today, please check with the pharmacy within an hour of our visit to make sure the prescription was transmitted appropriately.   Please try these tips to maintain a healthy lifestyle:  Eat at least 3 REAL meals and 1-2 snacks per day.  Aim for no more than 5 hours between eating.  If you eat breakfast, please do so within one hour of getting up.   Each meal should contain half fruits/vegetables, one quarter protein, and one quarter carbs (no bigger than a computer mouse)  Cut down on sweet beverages. This includes juice, soda, and sweet tea.   Drink at least 1 glass of water with each meal and aim for at least 8 glasses per day  Exercise at least 150 minutes every week.    Preventive Care 34-41 Years Old, Female Preventive care refers to lifestyle choices and visits with your health care provider that can promote health and wellness. Preventive care visits are also called wellness exams. What can I expect for my preventive care visit? Counseling During your preventive care visit, your health care provider may ask about your: Medical history, including: Past medical problems. Family medical history. Pregnancy history. Current health, including: Menstrual cycle. Method of  birth control. Emotional well-being. Home life and relationship well-being. Sexual activity and sexual health. Lifestyle, including: Alcohol, nicotine or tobacco, and drug use. Access to firearms. Diet, exercise, and sleep habits. Work and work Astronomer. Sunscreen use. Safety issues such as seatbelt and bike helmet use. Physical exam Your health care provider may check your: Height and weight. These may be used to calculate your BMI (body mass index). BMI is a measurement that tells if you are at a healthy weight. Waist circumference. This measures the distance around your waistline. This measurement also tells if you are at a healthy weight and may help predict your risk of certain diseases, such as type 2 diabetes and high blood pressure. Heart rate and blood pressure. Body temperature. Skin for abnormal spots. What immunizations do I need?  Vaccines are usually given at various ages, according to a schedule. Your health care provider will recommend vaccines for you based on your age, medical history, and lifestyle or other factors, such as travel or where you work. What tests do I need? Screening Your health care provider may recommend screening tests for certain conditions. This may include: Pelvic exam and Pap test. Lipid and cholesterol levels. Diabetes screening. This is done by checking your blood sugar (glucose) after you have not eaten for a while (fasting). Hepatitis B test. Hepatitis C test. HIV (human immunodeficiency virus) test. STI (sexually transmitted infection) testing,  if you are at risk. BRCA-related cancer screening. This may be done if you have a family history of breast, ovarian, tubal, or peritoneal cancers. Talk with your health care provider about your test results, treatment options, and if necessary, the need for more tests. Follow these instructions at home: Eating and drinking  Eat a healthy diet that includes fresh fruits and vegetables, whole  grains, lean protein, and low-fat dairy products. Take vitamin and mineral supplements as recommended by your health care provider. Do not drink alcohol if: Your health care provider tells you not to drink. You are pregnant, may be pregnant, or are planning to become pregnant. If you drink alcohol: Limit how much you have to 0-1 drink a day. Know how much alcohol is in your drink. In the [place] one drink equals one 12 oz bottle of beer (355 mL), one 5 oz glass of wine (148 mL), or one 1 oz glass of hard liquor (44 mL). Lifestyle Brush your teeth every morning and night with fluoride toothpaste. Floss one time each day. Exercise for at least 30 minutes 5 or more days each week. Do not use any products that contain nicotine or tobacco. These products include cigarettes, chewing tobacco, and vaping devices, such as e-cigarettes. If you need help quitting, ask your health care provider. Do not use drugs. If you are sexually active, practice safe sex. Use a condom or other form of protection to prevent STIs. If you do not wish to become pregnant, use a form of birth control. If you plan to become pregnant, see your health care provider for a prepregnancy visit. Find healthy ways to manage stress, such as: Meditation, yoga, or listening to music. Journaling. Talking to a trusted person. Spending time with friends and family. Minimize exposure to UV radiation to reduce your risk of skin cancer. Safety Always wear your seat belt while driving or riding in a vehicle. Do not drive: If you have been drinking alcohol. Do not ride with someone who has been drinking. If you have been using any mind-altering substances or drugs. While texting. When you are tired or distracted. Wear a helmet and other protective equipment during sports activities. If you have firearms in your house, make sure you follow all gun safety procedures. Seek help if you have been physically or sexually abused. What's  next? Go to your health care provider once a year for an annual wellness visit. Ask your health care provider how often you should have your eyes and teeth checked. Stay up to date on all vaccines. This information is not intended to replace advice given to you by your health care provider. Make sure you discuss any questions you have with your health care provider. Document Revised: 04/19/2021 Document Reviewed: 04/19/2021 Elsevier Patient Education  2024 [employer].

## 2023-07-29 NOTE — Assessment & Plan Note (Signed)
Following with psychiatry and therapy.  Overall symptoms are stable.  She is currently on Lamictal 150 mg daily and Wellbutrin 300 mg daily.

## 2023-07-29 NOTE — Assessment & Plan Note (Signed)
Following with psychiatry and therapist.  Overall symptoms are stable on lamictal 150 mg daily, Wellbutrin 300 mg daily and Ativan 1 mg every 8 hours as needed.

## 2023-07-29 NOTE — Assessment & Plan Note (Signed)
Following with OB/GYN.  She has had some irregular cycles recently but this is improving.

## 2023-07-30 LAB — CBC
HCT: 44.4 % (ref 36.0–46.0)
Hemoglobin: 14.5 g/dL (ref 12.0–15.0)
MCHC: 32.6 g/dL (ref 30.0–36.0)
MCV: 89.3 fl (ref 78.0–100.0)
Platelets: 221 10*3/uL (ref 150.0–400.0)
RBC: 4.97 Mil/uL (ref 3.87–5.11)
RDW: 13.6 % (ref 11.5–15.5)
WBC: 8.5 10*3/uL (ref 4.0–10.5)

## 2023-07-30 LAB — COMPREHENSIVE METABOLIC PANEL
ALT: 12 U/L (ref 0–35)
AST: 21 U/L (ref 0–37)
Albumin: 4.1 g/dL (ref 3.5–5.2)
Alkaline Phosphatase: 62 U/L (ref 39–117)
BUN: 11 mg/dL (ref 6–23)
CO2: 26 mEq/L (ref 19–32)
Calcium: 9.3 mg/dL (ref 8.4–10.5)
Chloride: 104 mEq/L (ref 96–112)
Creatinine, Ser: 0.73 mg/dL (ref 0.40–1.20)
GFR: 104.59 mL/min (ref >60.00)
Glucose, Bld: 75 mg/dL (ref 70–99)
Potassium: 3.6 mEq/L (ref 3.5–5.1)
Sodium: 139 mEq/L (ref 135–145)
Total Bilirubin: 0.6 mg/dL (ref 0.2–1.2)
Total Protein: 6.5 g/dL (ref 6.0–8.3)

## 2023-07-30 LAB — LIPID PANEL
Cholesterol: 154 mg/dL (ref 0–200)
HDL: 63.2 mg/dL (ref >39.00)
LDL Cholesterol: 77 mg/dL (ref 0–99)
NonHDL: 90.95
Total CHOL/HDL Ratio: 2
Triglycerides: 71 mg/dL (ref 0.0–149.0)
VLDL: 14.2 mg/dL (ref 0.0–40.0)

## 2023-07-30 LAB — TSH: TSH: 2.21 u[IU]/mL (ref 0.35–5.50)

## 2023-07-30 LAB — HEMOGLOBIN A1C: Hgb A1c MFr Bld: 5.2 % (ref 4.6–6.5)

## 2023-08-01 NOTE — Progress Notes (Signed)
Great news! Labs are all normal.  She should keep up the great work with diet and exercise and we can recheck everything in a year or so.

## 2023-10-18 ENCOUNTER — Ambulatory Visit: Payer: BC Managed Care – PPO | Admitting: Family Medicine

## 2024-06-04 ENCOUNTER — Ambulatory Visit (INDEPENDENT_AMBULATORY_CARE_PROVIDER_SITE_OTHER): Payer: Self-pay | Admitting: Family

## 2024-06-04 ENCOUNTER — Encounter: Payer: Self-pay | Admitting: Family

## 2024-06-04 VITALS — BP 118/66 | HR 60 | Temp 98.2°F | Ht 62.0 in | Wt 124.4 lb

## 2024-06-04 DIAGNOSIS — L237 Allergic contact dermatitis due to plants, except food: Secondary | ICD-10-CM

## 2024-06-04 MED ORDER — TRIAMCINOLONE ACETONIDE 0.1 % EX CREA: 1.0000 | TOPICAL_CREAM | Freq: Two times a day (BID) | CUTANEOUS | 0 refills | Status: DC

## 2024-06-04 MED ORDER — PREDNISONE 10 MG PO TABS: ORAL_TABLET | ORAL | 0 refills | Status: DC

## 2024-06-04 NOTE — Progress Notes (Signed)
 Patient ID: Kosisochukwu Goldberg, female    DOB: 12-11-1985, 38 y.o.   MRN: [id number]  Chief Complaint  Patient presents with   Poison Ivy    Pt c/o poison ivy on bilateral legs and abdomen, present for 2 weeks. Has tried prednisone  which did help slightly.  HPI: Poison ivy exposure causing rash all over both lower legs, with small patches of excoriated skin, a patch on right knee and on her stomach. She states the rash started 2 weeks while helping clear brush in the woods. She also mountain bikes on trails frequently. She took a prednisone  titration pack that she finished this past Monday that helped her symptoms, but the rash has resurfaced in the last couple of days. Very itchy. She has used benadryl cream with some relief and taking oral Benadryl at bedtime to help with sleep.  ASSESSMENT & PLAN  1. Poison oak dermatitis (Primary) Sending another round of prednisone , she tolerated first round fine, no SE. Advised to continue the Benadryl cream prn, Benadryl qhs, and sending Triamcinolone  cream for itchier areas.  Advised to call office back if still not resolved after finishing the prednisone . Can also try generic Aveeno oatmeal bath for itching relief. Keep all areas of body covered that may be exposed to future outbreaks.  - predniSONE  (DELTASONE ) 10 MG tablet; Take after breakfast:  5 tab day 1, 4 tab day 2-3, 3 tab day 4, 2 tab day 5  Dispense: 18 tablet; Refill: 0 - triamcinolone  cream (KENALOG ) 0.1 %; Apply 1 Application topically 2 (two) times daily. Apply to skin rash.  Dispense: 30 g; Refill: 0  Subjective:    Outpatient Medications Prior to Visit  Medication Sig Dispense Refill   buPROPion  (WELLBUTRIN  XL) 300 MG 24 hr tablet Take 1 tablet (300 mg total) by mouth daily. For mood ocntrol 30 tablet 0   lamoTRIgine  (LAMICTAL ) 150 MG tablet Take 150 mg by mouth daily.     methylPREDNISolone (MEDROL DOSEPAK) 4 MG TBPK tablet Take by mouth.     PARAGARD INTRAUTERINE COPPER IU by  Intrauterine route.     LORazepam  (ATIVAN ) 1 MG tablet Take 1 mg by mouth every 8 (eight) hours. (Patient not taking: Reported on 06/04/2024)     No facility-administered medications prior to visit.   Past Medical History:  Diagnosis Date   Amenorrhea following discontinuation of oral contraceptive use    none since 2013   Bipolar 1 disorder (HCC)    Bipolar 1 disorder (HCC)    Sprain, finger 01/06/2021   Suicide attempt [facility])    Past Surgical History:  Procedure Laterality Date   WISDOM TOOTH EXTRACTION     No Known Allergies    Objective:    Physical Exam Vitals and nursing note reviewed.  Constitutional:      Appearance: Normal appearance.  Cardiovascular:     Rate and Rhythm: Normal rate and regular rhythm.  Pulmonary:     Effort: Pulmonary effort is normal.     Breath sounds: Normal breath sounds.  Musculoskeletal:        General: Normal range of motion.  Skin:    General: Skin is warm and dry.     Findings: Rash (patches of red excoriation on bilateral lower legs, right knee and stomach, no drainage or crusting noted) present.  Neurological:     Mental Status: She is alert.  Psychiatric:        Mood and Affect: Mood normal.  Behavior: Behavior normal.    BP 118/66 (BP Location: Left Arm, Patient Position: Sitting, Cuff Size: Normal)   Pulse 60   Temp 98.2 F (36.8 C) (Temporal)   Ht 5' 2 (1.575 m)   Wt 124 lb 6.4 oz (56.4 kg)   LMP 05/08/2024 (Approximate)   SpO2 99%   BMI 22.75 kg/m  Wt Readings from Last 3 Encounters:  06/04/24 124 lb 6.4 oz (56.4 kg)  07/29/23 128 lb 3.2 oz (58.2 kg)  06/19/22 119 lb 9.6 oz (54.3 kg)      Lucius Krabbe, NP

## 2024-07-30 ENCOUNTER — Telehealth: Payer: Self-pay | Admitting: *Deleted

## 2024-07-30 NOTE — Telephone Encounter (Signed)
 Copied from CRM [id number]. Topic: Clinical - Medication Question >> Jul 30, 2024 12:01 PM Roselie BROCKS wrote: Reason for CRM: Covid shot Brand Name: no preference Pharmacy: [id number] - [place], [place] - [address] [place]  [id number]   LVM no need for Rx for Covid Vaccine, contact your pharmacy for appt  [facility]

## 2024-08-03 ENCOUNTER — Encounter: Payer: BC Managed Care – PPO | Admitting: Family Medicine

## 2024-09-03 ENCOUNTER — Ambulatory Visit: Admitting: Family Medicine

## 2024-09-03 ENCOUNTER — Encounter: Payer: Self-pay | Admitting: Family Medicine

## 2024-09-03 ENCOUNTER — Other Ambulatory Visit

## 2024-09-03 ENCOUNTER — Ambulatory Visit: Attending: Family Medicine

## 2024-09-03 VITALS — BP 110/70 | HR 75 | Temp 98.1°F | Ht 62.0 in | Wt 130.4 lb

## 2024-09-03 DIAGNOSIS — I499 Cardiac arrhythmia, unspecified: Secondary | ICD-10-CM | POA: Diagnosis not present

## 2024-09-03 DIAGNOSIS — Z13220 Encounter for screening for lipoid disorders: Secondary | ICD-10-CM

## 2024-09-03 DIAGNOSIS — Z131 Encounter for screening for diabetes mellitus: Secondary | ICD-10-CM

## 2024-09-03 LAB — HEMOGLOBIN A1C: Hgb A1c MFr Bld: 5.3 % (ref 4.6–6.5)

## 2024-09-03 LAB — CBC
HCT: 44.3 % (ref 36.0–46.0)
Hemoglobin: 15 g/dL (ref 12.0–15.0)
MCHC: 33.9 g/dL (ref 30.0–36.0)
MCV: 87.5 fl (ref 78.0–100.0)
Platelets: 348 K/uL (ref 150.0–400.0)
RBC: 5.07 Mil/uL (ref 3.87–5.11)
RDW: 13.7 % (ref 11.5–15.5)
WBC: 6.6 K/uL (ref 4.0–10.5)

## 2024-09-03 NOTE — Progress Notes (Signed)
   Casey Glenn is a 38 y.o. female who presents today for an office visit.  Assessment/Plan:  Palpitations  Normal cardiac exam.  No red flags.  Unfortunately EKG machine in office is not functioning today and this was not able to be obtained.  Based on history sounds like she is probably having ectopy.  We will order Holter monitor and check labs today.  We discussed avoidance of triggers.  We also discussed warning signs and red flags requiring immediate attention including chest pain, shortness of breath, dizziness, syncope, etc.  She will come back in a few weeks for her physical though she will let us  know if she has any change in symptoms between now and then.     Subjective:  HPI:  See assessment / plan for status of chronic conditions.   Discussed the use of AI scribe software for clinical note transcription with the patient, who gave verbal consent to proceed.  History of Present Illness Casey Glenn is a 38 year old female who presents with irregular heart rate.  She has been experiencing a fluttering sensation in her chest for the past five days, which began after a long cycling session. The sensation is described as a flutter that lasts a couple of minutes, mostly noticeable when she first gets into bed or after dinner. She has had similar sensations after big rides in the past, but they usually resolved within an hour. This time, the fluttering has persisted.  No dizziness or lightheadedness. The fluttering is most noticeable in the morning and evening, and she has not felt it during exercise. She has not tried any specific treatments for the fluttering.  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 She has increased her physical activity, adding more strength training to her routine, and has not taken a rest day in about three weeks.  She mentions a change in her supplement intake, having stopped taking  magnesium  and amino acids after her race. She also forgot to take her electrolyte drink during the ride when the symptoms began. She has not started any new supplements or over-the-counter medications recently.         Objective:  Physical Exam: BP 110/70   Pulse 75   Temp 98.1 F (36.7 C) (Temporal)   Ht 5' 2 (1.575 m)   Wt 130 lb 6.4 oz (59.1 kg)   LMP 08/03/2024   SpO2 98%   BMI 23.85 kg/m   Gen: No acute distress, resting comfortably CV: Regular rate and rhythm with no murmurs appreciated Pulm: Normal work of breathing, clear to auscultation bilaterally with no crackles, wheezes, or rhonchi Neuro: Grossly normal, moves all extremities Psych: Normal affect and thought content      Mayra Jolliffe M. Kennyth, MD 09/03/2024 8:57 AM

## 2024-09-03 NOTE — Progress Notes (Unsigned)
 EP to read.

## 2024-09-03 NOTE — Patient Instructions (Addendum)
 It was very nice to see you today!  VISIT SUMMARY: Today, we discussed your recent experience with an irregular heart rate and fluttering sensation in your chest. We reviewed your symptoms and potential contributing factors, and we have a plan to monitor and address these issues.  YOUR PLAN: PALPITATIONS: You have been experiencing a fluttering sensation in your chest, likely due to benign premature atrial or ventricular contractions. This could be related to electrolyte imbalance, dehydration, or overexertion. -We will order a Holter monitor for continuous cardiac monitoring to better understand your heart rhythm. -We will perform blood work to check your electrolyte levels and thyroid  function. -You should continue cycling unless you experience intense chest pain, dizziness, or fainting. -We will discuss potential insurance coverage for the thyroid  test. -We will arrange for you to contact a cardiologist to set up the Holter monitor.  Return if symptoms worsen or fail to improve.   Take care, Dr Kennyth  PLEASE NOTE:  If you had any lab tests, please let us  know if you have not heard back within a few days. You may see your results on mychart before we have a chance to review them but we will give you a call once they are reviewed by us .   If we ordered any referrals today, please let us  know if you have not heard from their office within the next week.   If you had any urgent prescriptions sent in today, please check with the pharmacy within an hour of our visit to make sure the prescription was transmitted appropriately.   Please try these tips to maintain a healthy lifestyle:  Eat at least 3 REAL meals and 1-2 snacks per day.  Aim for no more than 5 hours between eating.  If you eat breakfast, please do so within one hour of getting up.   Each meal should contain half fruits/vegetables, one quarter protein, and one quarter carbs (no bigger than a computer mouse)  Cut down on sweet  beverages. This includes juice, soda, and sweet tea.   Drink at least 1 glass of water with each meal and aim for at least 8 glasses per day  Exercise at least 150 minutes every week.

## 2024-09-04 ENCOUNTER — Ambulatory Visit: Payer: Self-pay

## 2024-09-04 ENCOUNTER — Other Ambulatory Visit (INDEPENDENT_AMBULATORY_CARE_PROVIDER_SITE_OTHER)

## 2024-09-04 DIAGNOSIS — I499 Cardiac arrhythmia, unspecified: Secondary | ICD-10-CM | POA: Diagnosis not present

## 2024-09-04 DIAGNOSIS — Z13220 Encounter for screening for lipoid disorders: Secondary | ICD-10-CM

## 2024-09-04 NOTE — Telephone Encounter (Signed)
 Answer Assessment - Initial Assessment Questions 1. DESCRIPTION: Please describe your heart rate or heartbeat that you are having (e.g., fast/slow, regular/irregular, skipped or extra beats, palpitations)     Feels more fluttering than usual; feeling it more throughout the day, not sure if it's work Denies chest pain, sob, faint or weakness 2. ONSET: When did it start? (e.g., minutes, hours, days)      today  3. OTHER SYMPTOMS: Do you have any other symptoms? (e.g., dizziness, chest pain, sweating, difficulty breathing)       Denies  Have not received heart monitor at this time or call from cardiology.  Advised ED/ 911 if symptoms worsen.  Patient reports lab work has to be repeated today, EKG  Would like to get labs and EKG done today; unable to take off additional time from work.  Voiced concerns  Protocols used: Heart Rate and Heartbeat Questions-A-AH

## 2024-09-04 NOTE — Telephone Encounter (Signed)
 Called [facility], spoke with Havlyn. Regarding patient's request to get EKG done today, since she is returning to repeat labs.Havlyn request to transfer patient to her, call transferred.

## 2024-09-04 NOTE — Addendum Note (Signed)
 Addended by: IDA ELORA HERO on: 09/04/2024 03:04 PM   Modules accepted: Orders

## 2024-09-04 NOTE — Addendum Note (Signed)
 Addended by: IDA ELORA HERO on: 09/04/2024 02:54 PM   Modules accepted: Orders

## 2024-09-05 LAB — COMPREHENSIVE METABOLIC PANEL WITH GFR
AG Ratio: 2.1 (calc) (ref 1.0–2.5)
ALT: 16 U/L (ref 6–29)
AST: 20 U/L (ref 10–30)
Albumin: 4.7 g/dL (ref 3.6–5.1)
Alkaline phosphatase (APISO): 66 U/L (ref 31–125)
BUN: 12 mg/dL (ref 7–25)
CO2: 25 mmol/L (ref 20–32)
Calcium: 9.7 mg/dL (ref 8.6–10.2)
Chloride: 103 mmol/L (ref 98–110)
Creat: 0.71 mg/dL (ref 0.50–0.97)
Globulin: 2.2 g/dL (ref 1.9–3.7)
Glucose, Bld: 86 mg/dL (ref 65–99)
Potassium: 4 mmol/L (ref 3.5–5.3)
Sodium: 138 mmol/L (ref 135–146)
Total Bilirubin: 0.5 mg/dL (ref 0.2–1.2)
Total Protein: 6.9 g/dL (ref 6.1–8.1)
eGFR: 111 mL/min/1.73m2 (ref >=60)

## 2024-09-05 LAB — LIPID PANEL
Cholesterol: 171 mg/dL (ref <200)
HDL: 69 mg/dL (ref >=50)
LDL Cholesterol (Calc): 81 mg/dL
Non-HDL Cholesterol (Calc): 102 mg/dL (ref <130)
Total CHOL/HDL Ratio: 2.5 (calc) (ref <5.0)
Triglycerides: 114 mg/dL (ref <150)

## 2024-09-05 LAB — MAGNESIUM: Magnesium: 2.2 mg/dL (ref 1.5–2.5)

## 2024-09-05 LAB — CK: Total CK: 78 U/L (ref 20–239)

## 2024-09-05 LAB — TSH: TSH: 4.61 m[IU]/L — ABNORMAL HIGH

## 2024-09-05 LAB — EXTRA LAV TOP TUBE

## 2024-09-08 ENCOUNTER — Ambulatory Visit: Payer: Self-pay | Admitting: Family Medicine

## 2024-09-08 DIAGNOSIS — E038 Other specified hypothyroidism: Secondary | ICD-10-CM

## 2024-09-08 NOTE — Progress Notes (Signed)
 Her TSH is elevated.  This may explain some of the symptoms that she was having.  Recommend she come back in a few weeks to recheck TSH, free T4, and free T3.  All of her other labs are at goal.  We will contact her once we have results back on her heart monitor.

## 2024-09-14 ENCOUNTER — Ambulatory Visit: Admitting: Family Medicine

## 2024-09-15 ENCOUNTER — Other Ambulatory Visit (INDEPENDENT_AMBULATORY_CARE_PROVIDER_SITE_OTHER)

## 2024-09-15 DIAGNOSIS — E038 Other specified hypothyroidism: Secondary | ICD-10-CM

## 2024-09-15 LAB — TSH: TSH: 2.9 u[IU]/mL (ref 0.35–5.50)

## 2024-09-15 LAB — T3, FREE: T3, Free: 2.8 pg/mL (ref 2.3–4.2)

## 2024-09-15 LAB — T4, FREE: Free T4: 0.64 ng/dL (ref 0.60–1.60)

## 2024-09-17 ENCOUNTER — Ambulatory Visit: Payer: Self-pay | Admitting: Family Medicine

## 2024-09-17 DIAGNOSIS — I499 Cardiac arrhythmia, unspecified: Secondary | ICD-10-CM | POA: Diagnosis not present

## 2024-09-17 NOTE — Progress Notes (Signed)
 Her thyroid  test are back to normal.  We do not need to do any further testing for this at this point.  I am still waiting on the final results of her heart monitor however the preliminary report does indicate that she had junctional rhythm at the time of symptoms.  She also does have very rare extra beats.  These are benign and generally do not need any further investigation however if she is still having quite a bit of symptoms there are medications that may help with this.  Recommend we send her to see cardiology if she wishes to discuss treatment options for this.

## 2024-09-18 ENCOUNTER — Other Ambulatory Visit: Payer: Self-pay | Admitting: *Deleted

## 2024-09-18 DIAGNOSIS — I498 Other specified cardiac arrhythmias: Secondary | ICD-10-CM

## 2024-09-21 NOTE — Progress Notes (Signed)
 Please see previous note regarding her heart monitor.  Her final read shows that she has occasional extra beats in her heart.  These are benign and do not cause any issue but we can refer her to see the cardiologist for further evaluation if she is agreeable.

## 2024-09-25 ENCOUNTER — Ambulatory Visit (INDEPENDENT_AMBULATORY_CARE_PROVIDER_SITE_OTHER): Admitting: Family Medicine

## 2024-09-25 ENCOUNTER — Encounter: Payer: Self-pay | Admitting: Family Medicine

## 2024-09-25 VITALS — BP 120/80 | HR 69 | Temp 98.3°F | Wt 131.2 lb

## 2024-09-25 DIAGNOSIS — R7989 Other specified abnormal findings of blood chemistry: Secondary | ICD-10-CM | POA: Diagnosis not present

## 2024-09-25 DIAGNOSIS — Z0001 Encounter for general adult medical examination with abnormal findings: Secondary | ICD-10-CM

## 2024-09-25 DIAGNOSIS — F339 Major depressive disorder, recurrent, unspecified: Secondary | ICD-10-CM

## 2024-09-25 DIAGNOSIS — F319 Bipolar disorder, unspecified: Secondary | ICD-10-CM | POA: Diagnosis not present

## 2024-09-25 DIAGNOSIS — F419 Anxiety disorder, unspecified: Secondary | ICD-10-CM | POA: Insufficient documentation

## 2024-09-25 DIAGNOSIS — R002 Palpitations: Secondary | ICD-10-CM

## 2024-09-25 NOTE — Assessment & Plan Note (Signed)
 Follows with psychiatry and therapy.  Symptoms are stable on Wellbutrin  300 mg daily and Lamictal  150 mg daily.  Also working with a paramedic.

## 2024-09-25 NOTE — Assessment & Plan Note (Signed)
 Continue management per psychiatry.  She is on Lamictal  150 mg daily Wellbutrin  300 mg daily.

## 2024-09-25 NOTE — Assessment & Plan Note (Signed)
 She has had a little more worsening anxiety lately related to her palpitations but overall this is currently manageable.  She will continue management per psychiatry.

## 2024-09-25 NOTE — Patient Instructions (Signed)
 It was very nice to see you today!  VISIT SUMMARY: Today, you had your annual physical exam and discussed some heart-related symptoms and anxiety issues. Your physical exam was normal, and we talked about your exercise routine, anxiety management, and family history of heart issues.  YOUR PLAN: ADULT WELLNESS VISIT: Routine adult wellness visit with no significant findings. -Continue your current lifestyle and exercise regimen. -Make sure to stay hydrated and maintain electrolyte balance during physical activities.  PALPITATIONS: You have intermittent palpitations with occasional skipped beats. Your heart monitor showed no significant abnormalities -You are referred to a cardiologist for further evaluation. -Discuss the possibility of an echocardiogram and beta blocker therapy with the cardiologist. -You prefer reassurance over medication at this time.  ANXIETY: Your anxiety symptoms are similar to past panic attacks and may be contributing to your palpitations. -Continue with your current therapy for anxiety management. -Maintain lifestyle modifications to help manage anxiety. -You prefer reassurance over medication at this time.  Return in about 1 year (around 09/25/2025) for Annual Physical.   Take care, Dr Kennyth  PLEASE NOTE:  If you had any lab tests, please let us  know if you have not heard back within a few days. You may see your results on mychart before we have a chance to review them but we will give you a call once they are reviewed by us .   If we ordered any referrals today, please let us  know if you have not heard from their office within the next week.   If you had any urgent prescriptions sent in today, please check with the pharmacy within an hour of our visit to make sure the prescription was transmitted appropriately.   Please try these tips to maintain a healthy lifestyle:  Eat at least 3 REAL meals and 1-2 snacks per day.  Aim for no more than 5 hours between  eating.  If you eat breakfast, please do so within one hour of getting up.   Each meal should contain half fruits/vegetables, one quarter protein, and one quarter carbs (no bigger than a computer mouse)  Cut down on sweet beverages. This includes juice, soda, and sweet tea.   Drink at least 1 glass of water with each meal and aim for at least 8 glasses per day  Exercise at least 150 minutes every week.    Preventive Care 76-58 Years Old, Female Preventive care refers to lifestyle choices and visits with your health care provider that can promote health and wellness. Preventive care visits are also called wellness exams. What can I expect for my preventive care visit? Counseling During your preventive care visit, your health care provider may ask about your: Medical history, including: Past medical problems. Family medical history. Pregnancy history. Current health, including: Menstrual cycle. Method of birth control. Emotional well-being. Home life and relationship well-being. Sexual activity and sexual health. Lifestyle, including: Alcohol, nicotine or tobacco, and drug use. Access to firearms. Diet, exercise, and sleep habits. Work and work astronomer. Sunscreen use. Safety issues such as seatbelt and bike helmet use. Physical exam Your health care provider may check your: Height and weight. These may be used to calculate your BMI (body mass index). BMI is a measurement that tells if you are at a healthy weight. Waist circumference. This measures the distance around your waistline. This measurement also tells if you are at a healthy weight and may help predict your risk of certain diseases, such as type 2 diabetes and high blood pressure. Heart rate  and blood pressure. Body temperature. Skin for abnormal spots. What immunizations do I need?  Vaccines are usually given at various ages, according to a schedule. Your health care provider will recommend vaccines for you based  on your age, medical history, and lifestyle or other factors, such as travel or where you work. What tests do I need? Screening Your health care provider may recommend screening tests for certain conditions. This may include: Pelvic exam and Pap test. Lipid and cholesterol levels. Diabetes screening. This is done by checking your blood sugar (glucose) after you have not eaten for a while (fasting). Hepatitis B test. Hepatitis C test. HIV (human immunodeficiency virus) test. STI (sexually transmitted infection) testing, if you are at risk. BRCA-related cancer screening. This may be done if you have a family history of breast, ovarian, tubal, or peritoneal cancers. Talk with your health care provider about your test results, treatment options, and if necessary, the need for more tests. Follow these instructions at home: Eating and drinking  Eat a healthy diet that includes fresh fruits and vegetables, whole grains, lean protein, and low-fat dairy products. Take vitamin and mineral supplements as recommended by your health care provider. Do not drink alcohol if: Your health care provider tells you not to drink. You are pregnant, may be pregnant, or are planning to become pregnant. If you drink alcohol: Limit how much you have to 0-1 drink a day. Know how much alcohol is in your drink. In the [place] one drink equals one 12 oz bottle of beer (355 mL), one 5 oz glass of wine (148 mL), or one 1 oz glass of hard liquor (44 mL). Lifestyle Brush your teeth every morning and night with fluoride toothpaste. Floss one time each day. Exercise for at least 30 minutes 5 or more days each week. Do not use any products that contain nicotine or tobacco. These products include cigarettes, chewing tobacco, and vaping devices, such as e-cigarettes. If you need help quitting, ask your health care provider. Do not use drugs. If you are sexually active, practice safe sex. Use a condom or other form of protection  to prevent STIs. If you do not wish to become pregnant, use a form of birth control. If you plan to become pregnant, see your health care provider for a prepregnancy visit. Find healthy ways to manage stress, such as: Meditation, yoga, or listening to music. Journaling. Talking to a trusted person. Spending time with friends and family. Minimize exposure to UV radiation to reduce your risk of skin cancer. Safety Always wear your seat belt while driving or riding in a vehicle. Do not drive: If you have been drinking alcohol. Do not ride with someone who has been drinking. If you have been using any mind-altering substances or drugs. While texting. When you are tired or distracted. Wear a helmet and other protective equipment during sports activities. If you have firearms in your house, make sure you follow all gun safety procedures. Seek help if you have been physically or sexually abused. What's next? Go to your health care provider once a year for an annual wellness visit. Ask your health care provider how often you should have your eyes and teeth checked. Stay up to date on all vaccines. This information is not intended to replace advice given to you by your health care provider. Make sure you discuss any questions you have with your health care provider. Document Revised: 04/19/2021 Document Reviewed: 04/19/2021 Elsevier Patient Education  2024 [employer].

## 2024-09-25 NOTE — Progress Notes (Signed)
 Chief Complaint:  Casey Glenn is a 38 y.o. female who presents today for her annual comprehensive physical exam.    Assessment/Plan:  New/Acute Problems: Palpitation  Symptoms have improved since taking a 2-week break from her cycling.  We checked labs a few weeks ago with her notable for elevated TSH that normalized on recheck.  We also checked a Holter monitor which showed sinus rhythm with a few occasional ectopic beats.  Discussed with patient that her sensations may be due to the ectopic beats though she does have upcoming appointment with cardiology in a couple of weeks.  It is reassuring that symptoms have began to improve.   Elevated TSH Normalized on most recent recheck however she would like to have antibody level checked today.  Chronic Problems Addressed Today: Bipolar 1 disorder (HCC) Follows with psychiatry and therapy.  Symptoms are stable on Wellbutrin  300 mg daily and Lamictal  150 mg daily.  Also working with a therapist.  Depression, recurrent Continue management per psychiatry.  She is on Lamictal  150 mg daily Wellbutrin  300 mg daily.  Anxiety She has had a little more worsening anxiety lately related to her palpitations but overall this is currently manageable.  She will continue management per psychiatry.  Preventative Healthcare: Up-to-date on labs and vaccines.  Follows with gynecology for [facility].  Patient Counseling(The following topics were reviewed and/or handout was given):  -Nutrition: Stressed importance of moderation in sodium/caffeine intake, saturated fat and cholesterol, caloric balance, sufficient intake of fresh fruits, vegetables, and fiber.  -Stressed the importance of regular exercise.   -Substance Abuse: Discussed cessation/primary prevention of tobacco, alcohol, or other drug use; driving or other dangerous activities under the influence; availability of treatment for abuse.   -Injury prevention: Discussed safety belts, safety  helmets, smoke detector, smoking near bedding or upholstery.   -Sexuality: Discussed sexually transmitted diseases, partner selection, use of condoms, avoidance of unintended pregnancy and contraceptive alternatives.   -Dental health: Discussed importance of regular tooth brushing, flossing, and dental visits.  -Health maintenance and immunizations reviewed. Please refer to Health maintenance section.  Return to care in 1 year for next preventative visit.     Subjective:  HPI:  She has no acute complaints today. Patient is here today for her  annual physical.  See assessment / plan for status of chronic condtions.  Discussed the use of AI scribe software for clinical note transcription with the patient, who gave verbal consent to proceed.  History of Present Illness Casey Glenn is a 38 year old female who presents for an annual physical exam and evaluation of heart-related symptoms.  She recently took a two-week break from her regular exercise routine after feeling over-exercised following a hundred-mile race. She typically commutes by bike, covering six miles daily, and had not taken a rest day until recently. After the break, her symptoms of over-exercise resolved, although she experienced a slight recurrence, which was not as severe as before.  She has a history of PTSD symptoms and panic attacks during the pandemic, characterized by chest tightness, facial tingling, and dissociation. She has learned to manage these symptoms and has not experienced full-blown panic attacks since 2022-2023. However, she finds it challenging to differentiate between panic attack symptoms and heart-related symptoms, especially when anxious about heart test results.  She wonders if anxiety contributes to these sensations. Her family history includes arrhythmia issues in her father's sister and grandmother, prompting her father to recommend a cardiology consultation.  She recalls having a  cold in October,  which might have temporarily affected her thyroid  function. Her father suggested rechecking her thyroid  levels, and she is open to this.        09/25/2024    2:47 PM  Depression screen PHQ 2/9  Decreased Interest 0  Down, Depressed, Hopeless 0  PHQ - 2 Score 0  Altered sleeping 0  Tired, decreased energy 0  Change in appetite 0  Feeling bad or failure about yourself  0  Trouble concentrating 0  Moving slowly or fidgety/restless 0  Suicidal thoughts 0  PHQ-9 Score 0  Difficult doing work/chores Not difficult at all    Health Maintenance Due  Topic Date Due   Cervical Cancer Screening (HPV/Pap Cotest)  06/28/2024     ROS: Per HPI, otherwise a complete review of systems was negative.   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   Eczema 06/14/2021   PTSD (post-traumatic stress disorder) 06/14/2021   Bipolar 1 disorder (HCC) 09/30/2017   PCOS (polycystic ovarian syndrome) 02/23/2014   Amenorrhea 11/28/2012   Depression, recurrent 11/28/2012   Past Surgical History:  Procedure Laterality Date   WISDOM TOOTH EXTRACTION      Family History  Problem Relation Age of Onset   Mood Disorder Father    Hyperlipidemia Maternal Grandmother    Hyperlipidemia Paternal Grandmother    Mood Disorder Paternal Grandfather     Medications- reviewed and updated Current Outpatient Medications  Medication Sig Dispense Refill   buPROPion  (WELLBUTRIN  XL) 300 MG 24 hr tablet Take 1 tablet (300 mg total) by mouth daily. For mood ocntrol 30 tablet 0   lamoTRIgine  (LAMICTAL ) 150 MG tablet Take 150 mg by mouth daily.     PARAGARD  INTRAUTERINE COPPER IU by Intrauterine route.     No current facility-administered medications for this visit.    Allergies-reviewed and updated No Known Allergies  Social History   Socioeconomic History   Marital status: Married    Spouse name: Not on file   Number of children: Not on file   Years of education: Not on file   Highest education level: Not on file  Occupational History   Not on file  Tobacco Use   Smoking status: Never   Smokeless tobacco: Never  Substance and Sexual Activity   Alcohol use: No    Comment: Rarely   Drug use: No   Sexual activity: Yes    Birth control/protection: I.U.D.  Other Topics Concern   Not on file  Social History Narrative   Not on file   Social Drivers of Health   Financial Resource Strain: Not on file  Food Insecurity: Not on file  Transportation Needs: Not on file  Physical Activity: Not on file  Stress: Not on file  Social Connections: Not on file        Objective:  Physical Exam: BP 120/80   Pulse 69   Temp 98.3 F (36.8 C) (Temporal)   Wt 131 lb 3.2 oz (59.5 kg)   LMP 08/03/2024   SpO2 98%   BMI 24.00 kg/m   Body mass index is  24 kg/m. Wt Readings from Last 3 Encounters:  09/25/24 131 lb 3.2 oz (59.5 kg)  09/03/24 130 lb 6.4 oz (59.1 kg)  06/04/24 124 lb 6.4 oz (56.4 kg)   Gen: NAD, resting comfortably HEENT: TMs normal bilaterally. OP clear. No thyromegaly noted.  CV: RRR with no murmurs appreciated Pulm: NWOB, CTAB with no crackles, wheezes, or rhonchi GI: Normal bowel sounds present. Soft, Nontender, Nondistended. MSK: no edema, cyanosis, or clubbing noted Skin: warm, dry Neuro: CN2-12 grossly intact. Strength 5/5 in upper and lower extremities. Reflexes symmetric and intact bilaterally.  Psych: Normal affect and thought content     Larwence Tu M. Kennyth, MD 09/25/2024 2:48 PM

## 2024-09-28 LAB — TSH: TSH: 3.39 m[IU]/L

## 2024-09-28 LAB — T3, FREE: T3, Free: 3.5 pg/mL (ref 2.3–4.2)

## 2024-09-28 LAB — THYROGLOBULIN ANTIBODY: Thyroglobulin Ab: 1 [IU]/mL (ref <=1)

## 2024-09-28 LAB — T4, FREE: Free T4: 1 ng/dL (ref 0.8–1.8)

## 2024-09-30 LAB — ANTI-TPO AB (RDL): Anti-TPO Ab (RDL): <9 [IU]/mL (ref <9.0)

## 2024-10-05 ENCOUNTER — Ambulatory Visit: Payer: Self-pay | Admitting: Family Medicine

## 2024-10-05 NOTE — Progress Notes (Signed)
 All of her thyroid  labs are normal.

## 2024-10-20 ENCOUNTER — Encounter: Payer: Self-pay | Admitting: Cardiovascular Disease

## 2024-10-20 ENCOUNTER — Ambulatory Visit: Attending: Cardiovascular Disease | Admitting: Cardiovascular Disease

## 2024-10-20 VITALS — BP 120/70 | HR 59 | Ht 62.0 in | Wt 130.0 lb

## 2024-10-20 DIAGNOSIS — R002 Palpitations: Secondary | ICD-10-CM | POA: Insufficient documentation

## 2024-10-20 NOTE — Assessment & Plan Note (Signed)
 Casey Glenn was sent to me by her PCP, Worth Kitty, MD, because of palpitations.  She is an endurance bicycle rider.  She had palpitations after a long bicycle ride probably related to dehydration and lack of electrolytes.  She took time after that and her symptoms resolved.  She did have a 3-day event monitor that was essentially unremarkable with occasional PVCs.  She has had no recurrent symptoms.  No further workup is necessary at this time.

## 2024-10-20 NOTE — Progress Notes (Signed)
 10/20/2024 Casey Glenn   1986/05/09  [id number]  Primary Physician Kennyth Worth HERO, MD Primary Cardiologist: Dorn JINNY Lesches MD [facility] [place], [place]  HPI:  Casey Glenn is a 38 y.o. thin and fit appearing married Caucasian female with no children he works as a systems developer at [employer].  She was referred by her PCP, Dr. Worth Kennyth, for palpitations.  She has no cardiac risk factors.  She is an endurance bicyclist.  She does have bipolar disorder and panic attacks.  She had some palpitations after a long bike ride probably related to dehydration and lack of electrolytes.  After taking some time off from long distance riding her symptoms have resolved.  She did have a 3-day event monitor that was unremarkable except for occasional PVCs.   Active Medications[1]   Allergies[2]  Social History   Socioeconomic History   Marital status: Married    Spouse name: Not on file   Number of children: Not on file   Years of education: Not on file   Highest education level: Not on file  Occupational History   Not on file  Tobacco Use   Smoking status: Never   Smokeless tobacco: Never  Substance and Sexual Activity   Alcohol use: No    Comment: Rarely   Drug use: No   Sexual activity: Yes    Birth control/protection: I.U.D.  Other Topics Concern   Not on file  Social History Narrative   Not on file   Social Drivers of Health   Tobacco Use: Low Risk (09/25/2024)   Patient History    Smoking Tobacco Use: Never    Smokeless Tobacco Use: Never    Passive Exposure: Not on file  Financial Resource Strain: Not on file  Food Insecurity: Not on file  Transportation Needs: Not on file  Physical Activity: Not on file  Stress: Not on file  Social Connections: Not on file  Intimate Partner Violence: Not on file  Depression (PHQ2-9): Low Risk (09/25/2024)   Depression (PHQ2-9)    PHQ-2 Score: 0  Alcohol Screen: Not on file  Housing: Not on file   Utilities: Not on file  Health Literacy: Not on file     Review of Systems: General: negative for chills, fever, night sweats or weight changes.  Cardiovascular: negative for chest pain, dyspnea on exertion, edema, orthopnea, palpitations, paroxysmal nocturnal dyspnea or shortness of breath Dermatological: negative for rash Respiratory: negative for cough or wheezing Urologic: negative for hematuria Abdominal: negative for nausea, vomiting, diarrhea, bright red blood per rectum, melena, or hematemesis Neurologic: negative for visual changes, syncope, or dizziness All other systems reviewed and are otherwise negative except as noted above.    Blood pressure 120/70, pulse (!) 59, height 5' 2 (1.575 m), weight 130 lb (59 kg), SpO2 98%.  General appearance: alert and no distress Neck: no adenopathy, no carotid bruit, no JVD, supple, symmetrical, trachea midline, and thyroid  not enlarged, symmetric, no tenderness/mass/nodules Lungs: clear to auscultation bilaterally Heart: regular rate and rhythm, S1, S2 normal, no murmur, click, rub or gallop Extremities: extremities normal, atraumatic, no cyanosis or edema Pulses: 2+ and symmetric Skin: Skin color, texture, turgor normal. No rashes or lesions Neurologic: Grossly normal  EKG EKG Interpretation Date/Time:  Tuesday October 20 2024 14:43:39 EST Ventricular Rate:  59 PR Interval:  148 QRS Duration:  80 QT Interval:  432 QTC Calculation: 427 R Axis:   77  Text Interpretation: Sinus bradycardia No  previous ECGs available Confirmed by Court Carrier [phone]) on 10/20/2024 2:48:11 PM    ASSESSMENT AND PLAN:   Palpitations Ms. Cauthon was sent to me by her PCP, Worth Kitty, MD, because of palpitations.  She is an endurance bicycle rider.  She had palpitations after a long bicycle ride probably related to dehydration and lack of electrolytes.  She took time after that and her symptoms resolved.  She did have a 3-day event monitor that was  essentially unremarkable with occasional PVCs.  She has had no recurrent symptoms.  No further workup is necessary at this time.     Carrier DOROTHA Court MD FACP,FACC,FAHA, FSCAI 10/20/2024 2:57 PM    [1]  Current Meds  Medication Sig   buPROPion  (WELLBUTRIN  XL) 300 MG 24 hr tablet Take 1 tablet (300 mg total) by mouth daily. For mood ocntrol   lamoTRIgine  (LAMICTAL ) 150 MG tablet Take 150 mg by mouth daily.   PARAGARD INTRAUTERINE COPPER IU by Intrauterine route.  [2] No Known Allergies

## 2024-10-20 NOTE — Patient Instructions (Signed)

## 2025-10-04 ENCOUNTER — Encounter: Admitting: Family Medicine
